# Patient Record
Sex: Female | Born: 1970 | Race: Black or African American | Hispanic: No | Marital: Single | Smoking: Former smoker
Health system: Southern US, Community
[De-identification: ages and names within clinical notes are randomized; demographics above are authoritative.]

## PROBLEM LIST (undated history)

## (undated) DIAGNOSIS — M5430 Sciatica, unspecified side: Secondary | ICD-10-CM

## (undated) DIAGNOSIS — I10 Essential (primary) hypertension: Secondary | ICD-10-CM

## (undated) HISTORY — PX: CARPAL TUNNEL RELEASE: SHX101

## (undated) HISTORY — DX: Sciatica, unspecified side: M54.30

## (undated) HISTORY — DX: Essential (primary) hypertension: I10

---

## 2019-11-07 ENCOUNTER — Emergency Department (HOSPITAL_COMMUNITY): Payer: Medicaid - Out of State

## 2019-11-07 ENCOUNTER — Other Ambulatory Visit: Payer: Self-pay

## 2019-11-07 ENCOUNTER — Emergency Department (HOSPITAL_COMMUNITY)
Admission: EM | Admit: 2019-11-07 | Discharge: 2019-11-07 | Disposition: A | Discharge status: Home / Self Care | Payer: Medicaid - Out of State | Attending: Emergency Medicine | Admitting: Emergency Medicine

## 2019-11-07 DIAGNOSIS — R222 Localized swelling, mass and lump, trunk: Secondary | ICD-10-CM | POA: Insufficient documentation

## 2019-11-07 DIAGNOSIS — Z202 Contact with and (suspected) exposure to infections with a predominantly sexual mode of transmission: Secondary | ICD-10-CM | POA: Insufficient documentation

## 2019-11-07 DIAGNOSIS — R109 Unspecified abdominal pain: Secondary | ICD-10-CM | POA: Insufficient documentation

## 2019-11-07 DIAGNOSIS — R112 Nausea with vomiting, unspecified: Secondary | ICD-10-CM | POA: Insufficient documentation

## 2019-11-07 DIAGNOSIS — R300 Dysuria: Secondary | ICD-10-CM | POA: Insufficient documentation

## 2019-11-07 DIAGNOSIS — N939 Abnormal uterine and vaginal bleeding, unspecified: Secondary | ICD-10-CM | POA: Diagnosis not present

## 2019-11-07 DIAGNOSIS — R509 Fever, unspecified: Secondary | ICD-10-CM | POA: Insufficient documentation

## 2019-11-07 DIAGNOSIS — Z711 Person with feared health complaint in whom no diagnosis is made: Secondary | ICD-10-CM

## 2019-11-07 DIAGNOSIS — N898 Other specified noninflammatory disorders of vagina: Secondary | ICD-10-CM | POA: Diagnosis present

## 2019-11-07 LAB — CBC WITH DIFFERENTIAL/PLATELET
Abs Immature Granulocytes: 0 10*3/uL (ref 0.00–0.07)
Basophils Absolute: 0 10*3/uL (ref 0.0–0.1)
Basophils Relative: 0 %
Eosinophils Absolute: 0.3 10*3/uL (ref 0.0–0.5)
Eosinophils Relative: 4 %
HCT: 37.5 % (ref 36.0–46.0)
Hemoglobin: 11.7 g/dL — ABNORMAL LOW (ref 12.0–15.0)
Lymphocytes Relative: 25 %
Lymphs Abs: 2.1 10*3/uL (ref 0.7–4.0)
MCH: 28 pg (ref 26.0–34.0)
MCHC: 31.2 g/dL (ref 30.0–36.0)
MCV: 89.7 fL (ref 80.0–100.0)
Monocytes Absolute: 1 10*3/uL (ref 0.1–1.0)
Monocytes Relative: 12 %
Neutro Abs: 4.9 10*3/uL (ref 1.7–7.7)
Neutrophils Relative %: 59 %
Platelets: 267 10*3/uL (ref 150–400)
RBC: 4.18 MIL/uL (ref 3.87–5.11)
RDW: 13.5 % (ref 11.5–15.5)
WBC: 8.3 10*3/uL (ref 4.0–10.5)
nRBC: 0 % (ref 0.0–0.2)
nRBC: 0 /100 WBC

## 2019-11-07 LAB — COMPREHENSIVE METABOLIC PANEL
ALT: 16 U/L (ref 0–44)
AST: 12 U/L — ABNORMAL LOW (ref 15–41)
Albumin: 3.1 g/dL — ABNORMAL LOW (ref 3.5–5.0)
Alkaline Phosphatase: 67 U/L (ref 38–126)
Anion gap: 11 (ref 5–15)
BUN: 8 mg/dL (ref 6–20)
CO2: 24 mmol/L (ref 22–32)
Calcium: 8.6 mg/dL — ABNORMAL LOW (ref 8.9–10.3)
Chloride: 101 mmol/L (ref 98–111)
Creatinine, Ser: 0.67 mg/dL (ref 0.44–1.00)
GFR, Estimated: >60 mL/min (ref >60)
Glucose, Bld: 115 mg/dL — ABNORMAL HIGH (ref 70–99)
Potassium: 3.2 mmol/L — ABNORMAL LOW (ref 3.5–5.1)
Sodium: 136 mmol/L (ref 135–145)
Total Bilirubin: 0.5 mg/dL (ref 0.3–1.2)
Total Protein: 7.1 g/dL (ref 6.5–8.1)

## 2019-11-07 LAB — I-STAT BETA HCG BLOOD, ED (MC, WL, AP ONLY): I-stat hCG, quantitative: <5 m[IU]/mL (ref <5)

## 2019-11-07 LAB — URINALYSIS, ROUTINE W REFLEX MICROSCOPIC
Bacteria, UA: NONE SEEN
Bilirubin Urine: NEGATIVE
Glucose, UA: NEGATIVE mg/dL
Ketones, ur: NEGATIVE mg/dL
Nitrite: NEGATIVE
Protein, ur: NEGATIVE mg/dL
Specific Gravity, Urine: 1.02 (ref 1.005–1.030)
pH: 5 (ref 5.0–8.0)

## 2019-11-07 LAB — WET PREP, GENITAL
Sperm: NONE SEEN
Trich, Wet Prep: NONE SEEN
Yeast Wet Prep HPF POC: NONE SEEN

## 2019-11-07 LAB — LACTIC ACID, PLASMA: Lactic Acid, Venous: 1.2 mmol/L (ref 0.5–1.9)

## 2019-11-07 LAB — HIV ANTIBODY (ROUTINE TESTING W REFLEX): HIV Screen 4th Generation wRfx: NONREACTIVE

## 2019-11-07 LAB — LIPASE, BLOOD: Lipase: 23 U/L (ref 11–51)

## 2019-11-07 MED ORDER — CEFTRIAXONE SODIUM 500 MG IJ SOLR
500.0000 mg | Freq: Once | INTRAMUSCULAR | Status: AC
  Administered 2019-11-07: 500 mg via INTRAMUSCULAR
  Filled 2019-11-07: qty 500

## 2019-11-07 MED ORDER — IOHEXOL 300 MG/ML  SOLN
100.0000 mL | Freq: Once | INTRAMUSCULAR | Status: AC | PRN
  Administered 2019-11-07: 100 mL via INTRAVENOUS

## 2019-11-07 MED ORDER — DEXTROSE 5 % IV SOLN: 500.0000 mg | Freq: Once | INTRAVENOUS | Status: DC

## 2019-11-07 MED ORDER — DOXYCYCLINE HYCLATE 100 MG PO CAPS: 100.0000 mg | ORAL_CAPSULE | Freq: Two times a day (BID) | ORAL | 0 refills | Status: DC

## 2019-11-07 MED ORDER — METRONIDAZOLE 500 MG PO TABS: 500.0000 mg | ORAL_TABLET | Freq: Two times a day (BID) | ORAL | 0 refills | Status: DC

## 2019-11-07 MED ORDER — LIDOCAINE HCL (PF) 1 % IJ SOLN
INTRAMUSCULAR | Status: AC
  Filled 2019-11-07: qty 5

## 2019-11-07 NOTE — Discharge Instructions (Addendum)
Begin taking doxycycline and Flagyl as prescribed.  Follow-up in the women's clinic in the next week.  Their contact information has been provided in this discharge summary for you to call make these arrangements.  We will call you if your cultures indicate you require further treatment or need to take additional action.  Return to the ER if symptoms significantly worsen or change.

## 2019-11-07 NOTE — ED Triage Notes (Signed)
Pt here with vaginal bleeding x2 weeks with bad cramps and odor. Pt also states she has a new lump on her left chest that is painless just concerning.

## 2019-11-07 NOTE — ED Provider Notes (Signed)
[facility] EMERGENCY DEPARTMENT Provider Note   CSN: [id number] Arrival date & time: 11/07/19  1032     History No chief complaint on file.   Veronica Gonzales is a 49 y.o. female.  The history is provided by the patient and medical records. No language interpreter was used.  Vaginal Discharge Quality:  Malodorous and thick Severity:  Severe Onset quality:  Gradual Duration:  2 weeks Timing:  Constant Progression:  Unchanged Chronicity:  New Relieved by:  Nothing Worsened by:  Nothing Ineffective treatments:  None tried Associated symptoms: abdominal pain, dysuria, fever, nausea and vomiting   Associated symptoms: no genital lesions, no urinary frequency, no urinary hesitancy and no urinary incontinence   Risk factors: new sexual partner   Risk factors: no PID and no STI        No past medical history on file.  There are no problems to display for this patient.     OB History   No obstetric history on file.     No family history on file.  Social History   Tobacco Use  . Smoking status: Not on file  Substance Use Topics  . Alcohol use: Not on file  . Drug use: Not on file    Home Medications Prior to Admission medications   Not on File    Allergies    Patient has no allergy information on record.  Review of Systems   Review of Systems  Constitutional: Positive for fatigue and fever. Negative for chills and diaphoresis.  HENT: Negative for congestion.   Eyes: Negative for visual disturbance.  Respiratory: Negative for cough, chest tightness, shortness of breath and wheezing.   Cardiovascular: Negative for chest pain and palpitations.  Gastrointestinal: Positive for abdominal pain, nausea and vomiting. Negative for constipation and diarrhea.  Genitourinary: Positive for dysuria, vaginal bleeding and vaginal discharge. Negative for bladder incontinence, decreased urine volume, flank pain, frequency and hesitancy.    Musculoskeletal: Negative for back pain, neck pain and neck stiffness.  Neurological: Negative for weakness, light-headedness, numbness and headaches.  Psychiatric/Behavioral: Negative for agitation.  All other systems reviewed and are negative.   Physical Exam Updated Vital Signs BP 134/87 (BP Location: Right Arm)   Pulse 71   Temp 98.6 F (37 C) (Oral)   Resp 17   Ht  (1.499 m)   Wt 98.9 kg   SpO2 98%   BMI 44.03 kg/m   Physical Exam Vitals and nursing note reviewed.  Constitutional:      General: She is not in acute distress.    Appearance: She is well-developed. She is not ill-appearing, toxic-appearing or diaphoretic.  HENT:     Head: Normocephalic and atraumatic.     Right Ear: External ear normal.     Left Ear: External ear normal.     Nose: Nose normal.     Mouth/Throat:     Mouth: Mucous membranes are moist.     Pharynx: No oropharyngeal exudate or posterior oropharyngeal erythema.  Eyes:     Conjunctiva/sclera: Conjunctivae normal.     Pupils: Pupils are equal, round, and reactive to light.  Cardiovascular:     Rate and Rhythm: Normal rate.     Pulses: Normal pulses.     Heart sounds: No murmur heard.   Pulmonary:     Effort: No respiratory distress.     Breath sounds: No stridor. No wheezing, rhonchi or rales.  Chest:     Chest wall: Swelling present. No tenderness.  Abdominal:     General: Abdomen is flat. There is no distension.     Tenderness: There is abdominal tenderness (mild). There is no right CVA tenderness, left CVA tenderness or rebound.  Musculoskeletal:        General: No tenderness.     Cervical back: Normal range of motion and neck supple. No tenderness.     Right lower leg: No edema.     Left lower leg: No edema.  Skin:    General: Skin is warm.     Capillary Refill: Capillary refill takes less than 2 seconds.     Findings: No erythema or rash.  Neurological:     General: No focal deficit present.     Mental Status: She  is alert and oriented to person, place, and time.     Motor: No abnormal muscle tone.     Coordination: Coordination normal.     Deep Tendon Reflexes: Reflexes are normal and symmetric.  Psychiatric:        Mood and Affect: Mood normal.     ED Results / Procedures / Treatments   Labs (all labs ordered are listed, but only abnormal results are displayed) Labs Reviewed  WET PREP, GENITAL - Abnormal; Notable for the following components:      Result Value   Clue Cells Wet Prep HPF POC PRESENT (*)    WBC, Wet Prep HPF POC MANY (*)    All other components within normal limits  CBC WITH DIFFERENTIAL/PLATELET - Abnormal; Notable for the following components:   Hemoglobin 11.7 (*)    All other components within normal limits  COMPREHENSIVE METABOLIC PANEL - Abnormal; Notable for the following components:   Potassium 3.2 (*)    Glucose, Bld 115 (*)    Calcium 8.6 (*)    Albumin 3.1 (*)    AST 12 (*)    All other components within normal limits  URINALYSIS, ROUTINE W REFLEX MICROSCOPIC - Abnormal; Notable for the following components:   APPearance HAZY (*)    Hgb urine dipstick SMALL (*)    Leukocytes,Ua LARGE (*)    All other components within normal limits  URINE CULTURE  CULTURE, BLOOD (ROUTINE X 2)  CULTURE, BLOOD (ROUTINE X 2)  LACTIC ACID, PLASMA  LIPASE, BLOOD  HIV ANTIBODY (ROUTINE TESTING W REFLEX)  LACTIC ACID, PLASMA  RPR  I-STAT BETA HCG BLOOD, ED ([facility], [facility], [facility] ONLY)  WET PREP  (BD AFFIRM) ([place])  GC/CHLAMYDIA PROBE AMP ([place]) NOT AT [facility]    EKG None  Radiology DG Chest 2 View  Result Date: 11/07/2019 CLINICAL DATA:  Left chest wall swelling. EXAM: CHEST - 2 VIEW COMPARISON:  None. FINDINGS: The heart size and mediastinal contours are within normal limits. Both lungs are clear. No visible pleural effusions or pneumothorax. The visualized skeletal structures are unremarkable. IMPRESSION: No active cardiopulmonary disease. Electronically Signed   By:  Feliberto Harts MD   On: 11/07/2019 12:34   US Transvaginal Non-OB  Result Date: 11/07/2019 CLINICAL DATA:  Vaginal discharge and bleeding, foul smelling discharge, lower abdominal pain; negative pregnancy test; LMP 10/26/2019 EXAM: TRANSABDOMINAL AND TRANSVAGINAL ULTRASOUND OF PELVIS DOPPLER ULTRASOUND OF OVARIES TECHNIQUE: Both transabdominal and transvaginal ultrasound examinations of the pelvis were performed. Transabdominal technique was performed for global imaging of the pelvis including uterus, ovaries, adnexal regions, and pelvic cul-de-sac. It was necessary to proceed with endovaginal exam following the transabdominal exam to visualize the endometrium, cervix, and LEFT ovary. Color and duplex Doppler  ultrasound was utilized to evaluate blood flow to the ovaries. COMPARISON:  None FINDINGS: Uterus Measurements: 12.9 x 4.4 x 5.7 cm = volume: 169 mL. Anteverted. No focal myometrial mass. Endometrium Thickness: 10 mm.  No endometrial fluid or focal abnormality. Right ovary Measurements: 3.7 x 2.9 x 3.8 cm = volume: 21.8 mL. Dominant physiologic follicle 2.9 cm diameter; no follow-up imaging recommended. Left ovary Not visualized, likely obscured by bowel Pulsed Doppler evaluation of RIGHT ovary demonstrates normal low-resistance arterial and venous waveforms. LEFT ovary not visualized for assessment Other findings Abnormal appearance of the cervix, distended by a combination of heterogeneous isoechoic to hypoechoic material measuring up to 5.1 x 3.2 x 3.5 cm. A portion of this appears to represent complex fluid question blood. An additional more echogenic focus 3.7 x 2.3 x 2.3 cm is seen which could potentially represent clot or an endocervical polyp. IMPRESSION: Nonvisualization of LEFT ovary. Normal appearing uterus, endometrial complex and RIGHT ovary. Abnormal appearance of the cervix, distended by a combination of heterogeneous isoechoic to hypoechoic material measuring up to 5.1 x 3.2 x 3.5 cm,  question blood and a 3.7 x 2.3 x 2.3 cm endocervical polyp versus clot; if sonohysterogram or hysteroscopy is not performed, recommend short-term follow-up ultrasound in 6 weeks to reassess. Electronically Signed   By: Ulyses Southward M.D.   On: 11/07/2019 15:17   US Pelvis Complete  Result Date: 11/07/2019 CLINICAL DATA:  Vaginal discharge and bleeding, foul smelling discharge, lower abdominal pain; negative pregnancy test; LMP 10/26/2019 EXAM: TRANSABDOMINAL AND TRANSVAGINAL ULTRASOUND OF PELVIS DOPPLER ULTRASOUND OF OVARIES TECHNIQUE: Both transabdominal and transvaginal ultrasound examinations of the pelvis were performed. Transabdominal technique was performed for global imaging of the pelvis including uterus, ovaries, adnexal regions, and pelvic cul-de-sac. It was necessary to proceed with endovaginal exam following the transabdominal exam to visualize the endometrium, cervix, and LEFT ovary. Color and duplex Doppler ultrasound was utilized to evaluate blood flow to the ovaries. COMPARISON:  None FINDINGS: Uterus Measurements: 12.9 x 4.4 x 5.7 cm = volume: 169 mL. Anteverted. No focal myometrial mass. Endometrium Thickness: 10 mm.  No endometrial fluid or focal abnormality. Right ovary Measurements: 3.7 x 2.9 x 3.8 cm = volume: 21.8 mL. Dominant physiologic follicle 2.9 cm diameter; no follow-up imaging recommended. Left ovary Not visualized, likely obscured by bowel Pulsed Doppler evaluation of RIGHT ovary demonstrates normal low-resistance arterial and venous waveforms. LEFT ovary not visualized for assessment Other findings Abnormal appearance of the cervix, distended by a combination of heterogeneous isoechoic to hypoechoic material measuring up to 5.1 x 3.2 x 3.5 cm. A portion of this appears to represent complex fluid question blood. An additional more echogenic focus 3.7 x 2.3 x 2.3 cm is seen which could potentially represent clot or an endocervical polyp. IMPRESSION: Nonvisualization of LEFT ovary.  Normal appearing uterus, endometrial complex and RIGHT ovary. Abnormal appearance of the cervix, distended by a combination of heterogeneous isoechoic to hypoechoic material measuring up to 5.1 x 3.2 x 3.5 cm, question blood and a 3.7 x 2.3 x 2.3 cm endocervical polyp versus clot; if sonohysterogram or hysteroscopy is not performed, recommend short-term follow-up ultrasound in 6 weeks to reassess. Electronically Signed   By: Ulyses Southward M.D.   On: 11/07/2019 15:17   [place] Art/Ven Flow Abd Pelv Doppler  Result Date: 11/07/2019 CLINICAL DATA:  Vaginal discharge and bleeding, foul smelling discharge, lower abdominal pain; negative pregnancy test; LMP 10/26/2019 EXAM: TRANSABDOMINAL AND TRANSVAGINAL ULTRASOUND OF PELVIS DOPPLER ULTRASOUND OF OVARIES TECHNIQUE:  Both transabdominal and transvaginal ultrasound examinations of the pelvis were performed. Transabdominal technique was performed for global imaging of the pelvis including uterus, ovaries, adnexal regions, and pelvic cul-de-sac. It was necessary to proceed with endovaginal exam following the transabdominal exam to visualize the endometrium, cervix, and LEFT ovary. Color and duplex Doppler ultrasound was utilized to evaluate blood flow to the ovaries. COMPARISON:  None FINDINGS: Uterus Measurements: 12.9 x 4.4 x 5.7 cm = volume: 169 mL. Anteverted. No focal myometrial mass. Endometrium Thickness: 10 mm.  No endometrial fluid or focal abnormality. Right ovary Measurements: 3.7 x 2.9 x 3.8 cm = volume: 21.8 mL. Dominant physiologic follicle 2.9 cm diameter; no follow-up imaging recommended. Left ovary Not visualized, likely obscured by bowel Pulsed Doppler evaluation of RIGHT ovary demonstrates normal low-resistance arterial and venous waveforms. LEFT ovary not visualized for assessment Other findings Abnormal appearance of the cervix, distended by a combination of heterogeneous isoechoic to hypoechoic material measuring up to 5.1 x 3.2 x 3.5 cm. A portion of this  appears to represent complex fluid question blood. An additional more echogenic focus 3.7 x 2.3 x 2.3 cm is seen which could potentially represent clot or an endocervical polyp. IMPRESSION: Nonvisualization of LEFT ovary. Normal appearing uterus, endometrial complex and RIGHT ovary. Abnormal appearance of the cervix, distended by a combination of heterogeneous isoechoic to hypoechoic material measuring up to 5.1 x 3.2 x 3.5 cm, question blood and a 3.7 x 2.3 x 2.3 cm endocervical polyp versus clot; if sonohysterogram or hysteroscopy is not performed, recommend short-term follow-up ultrasound in 6 weeks to reassess. Electronically Signed   By: Ulyses Southward M.D.   On: 11/07/2019 15:17    Procedures Procedures (including critical care time)  Medications Ordered in ED Medications - No data to display  ED Course  I have reviewed the triage vital signs and the nursing notes.  Pertinent labs & imaging results that were available during my care of the patient were reviewed by me and considered in my medical decision making (see chart for details).    MDM Rules/Calculators/A&P                          Allona Gondek is a 49 y.o. female with no significant past medical history who presents with several complaints including 2 weeks of foul-smelling vaginal discharge, pelvic pain, mid and upper abdominal pain, intermittent fevers and chills, nausea/vomiting, and a left chest wall lump.  Patient reports that she has never had these symptoms in the past.  She denies history of STI, PID, BV, or recurrent urinary tract infections.  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  She reports that she has developed a very foul-smelling significant vaginal discharge and has had vaginal bleeding as well for the last 2 weeks.  She reports that she is still getting her menstrual cycle.  She says the abdominal pain she is experiencing  seen worse than her previous menstrual pain and was including her upper abdomen.  She says that this has been ongoing and waxing waning and is currently moderate in severity.  She reports it has been extremely severe.  She reports that several days ago she had fevers and chills.  She says that over the last week and a half she developed a large lump on her left upper chest which she does not  know was a lymph node or some other cause.  She denies any tenderness or pain at the site.  She denies any upper extremity symptoms.  She reports that last month she had her Covid vaccine in her left upper arm but reports the lump did not show up shortly thereafter.  She denies any headache, vision changes, or other neurologic symptoms.  She denies any new rashes aside from a year-long rash on her right leg which is unchanged.  She presents for evaluation.  On exam with a chaperone, patient's lungs were clear.  Chest was nontender.  There is a large mobile bump in her left upper chest wall near the clavicle.  It is not pulsatile and did not have a bruit.  She had normal strength and sensation in upper extremities with no tenderness or bumps in the axilla.  Good pulses in upper extremities.  Abdomen was minimally tender on my exam inferiorly but she reports her previous pain was in the upper abdomen.  She denies any tenderness in her back and no rashes otherwise seen on her torso.  She did have a small rash on her right calf which she reports is chronic and does not appear acute or concerning at this time.  A pelvic exam was performed with a chaperone and she did have a vaginal discharge and had some mild cervical motion tenderness.  She did not have adnexal tenderness on my exam.  There was no vaginal laceration, abrasion, or injury seen.  No bleeding seen.  Clinically I am somewhat concerned about several etiologies of her symptoms.  With her new sexual partner, concerned about possible STI, PID, or even TOA given the  amount of pain with the intermittent fevers and chills with nausea and vomiting.  We discussed the possibility of infection being more disseminated including causing a lymph node in her chest wall.  With the upper abdominal pain with nausea and vomiting, consider Lynnae January or other more widespread infections.  We will start with a ultrasound of her pelvis to look for TOA or other abnormality causing the vaginal bleeding.  We will get swabs.  Anticipate empiric antibiotics for STI coverage.  She also get a chest x-ray given the fevers, chills, and this lump on her chest.  We will also test for HIV given consideration of a [place] lymph node.  Anticipate reassessment after work-up to determine disposition.  Patient's work-up began to return.  Her ultrasound does not show evidence of TOA or significant abnormalities.  There was abnormal appearance of the cervix with some distention which may have another due to discharge or clot.  Patient will have follow-up and we discussed this.  Clinically I do think she may have STI with PID given the discharge.  Her wet prep did show BV so this may attribute some of the discharge however due to the discomfort, she will need to be treated with antibiotics for possible STI or PID as well.  Other work-up began to return reassuring thus far.  Her chest x-ray did not show pneumonia or large shadow from the lymph node appearing mass.  I had a long shared decision made conversation with patient and there is still concern for possible lymphoma or other cause of this large nontender mass.  Patient is amenable to getting CT chest/abdomen/pelvis to look for concerning underlying findings.  If this is reassuring, anticipate discharge with antibiotics for STI/PID as well as instructions to follow-up with PCP and we will likely attribute the large  swelling/lymph node on her left chest due to recent Covid shot in her left arm which led to this lymphadenopathy.  If  abnormalities are discovered on imaging, will dispo accordingly.  Care transferred to Dr. Judd Lienelo while waiting for CT results.   Final Clinical Impression(s) / ED Diagnoses Final diagnoses:  Vaginal discharge  Vaginal bleeding  Concern about sexually transmitted disease in female without diagnosis  Mass of left chest wall   Clinical Impression: 1. Vaginal discharge   2. Vaginal bleeding   3. Concern about sexually transmitted disease in female without diagnosis   4. Mass of left chest wall     Disposition: Care transferred to Dr. Judd Lienelo while waiting for CT results.  This note was prepared with assistance of Conservation officer, historic buildingsDragon voice recognition software. Occasional wrong-word or sound-a-like substitutions may have occurred due to the inherent limitations of voice recognition software.      Welton Bord, Canary Brimhristopher J, MD 11/07/19 [id number]

## 2019-11-08 LAB — RPR: RPR Ser Ql: NONREACTIVE

## 2019-11-09 LAB — URINE CULTURE

## 2019-11-10 LAB — GC/CHLAMYDIA PROBE AMP (~~LOC~~) NOT AT ARMC
Chlamydia: NEGATIVE
Comment: NEGATIVE
Comment: NORMAL
Neisseria Gonorrhea: NEGATIVE

## 2019-11-12 LAB — CULTURE, BLOOD (ROUTINE X 2)
Culture: NO GROWTH
Culture: NO GROWTH
Special Requests: ADEQUATE
Special Requests: ADEQUATE

## 2021-04-13 DIAGNOSIS — M5416 Radiculopathy, lumbar region: Secondary | ICD-10-CM | POA: Insufficient documentation

## 2021-06-27 ENCOUNTER — Other Ambulatory Visit: Payer: Self-pay | Admitting: *Deleted

## 2021-06-27 DIAGNOSIS — K8020 Calculus of gallbladder without cholecystitis without obstruction: Secondary | ICD-10-CM

## 2021-07-11 ENCOUNTER — Ambulatory Visit
Admission: RE | Admit: 2021-07-11 | Discharge: 2021-07-11 | Disposition: A | Discharge status: Home / Self Care | Payer: 59 | Source: Ambulatory Visit | Attending: *Deleted | Admitting: *Deleted

## 2021-07-11 DIAGNOSIS — K8020 Calculus of gallbladder without cholecystitis without obstruction: Secondary | ICD-10-CM

## 2021-08-16 ENCOUNTER — Encounter (HOSPITAL_BASED_OUTPATIENT_CLINIC_OR_DEPARTMENT_OTHER): Payer: Self-pay | Admitting: Physical Therapy

## 2021-08-16 ENCOUNTER — Ambulatory Visit (HOSPITAL_BASED_OUTPATIENT_CLINIC_OR_DEPARTMENT_OTHER): Payer: Commercial Managed Care - HMO | Attending: Orthopedic Surgery | Admitting: Physical Therapy

## 2021-08-16 DIAGNOSIS — R29898 Other symptoms and signs involving the musculoskeletal system: Secondary | ICD-10-CM

## 2021-08-16 DIAGNOSIS — M5451 Vertebrogenic low back pain: Secondary | ICD-10-CM | POA: Diagnosis present

## 2021-08-16 DIAGNOSIS — Z5189 Encounter for other specified aftercare: Secondary | ICD-10-CM | POA: Diagnosis not present

## 2021-08-16 DIAGNOSIS — M6281 Muscle weakness (generalized): Secondary | ICD-10-CM

## 2021-08-16 DIAGNOSIS — M5459 Other low back pain: Secondary | ICD-10-CM | POA: Diagnosis not present

## 2021-08-16 DIAGNOSIS — R208 Other disturbances of skin sensation: Secondary | ICD-10-CM | POA: Diagnosis not present

## 2021-08-16 NOTE — Therapy (Signed)
OUTPATIENT PHYSICAL THERAPY THORACOLUMBAR EVALUATION   Patient Name: Veronica Gonzales MRN: [id number] DOB:[birth year], 51 y.o., female Today's Date: 08/16/2021   PT End of Session - 08/16/21 1548     Visit Number 1    Number of Visits 17    Date for PT Re-Evaluation 10/11/21    Authorization Time Period 08/16/21 to 10/11/21    PT Start Time 1443   arrived late   PT Stop Time 1514    PT Time Calculation (min) 31 min    Activity Tolerance Patient tolerated treatment well    Behavior During Therapy [facility] for tasks assessed/performed             History reviewed. No pertinent past medical history. History reviewed. No pertinent surgical history. There are no problems to display for this patient.   PCP: does not currently have   REFERRING PROVIDER: Venita Lick, MD   REFERRING DIAG: M54.51 (ICD-10-CM) - Vertebrogenic low back pain   Rationale for Evaluation and Treatment Rehabilitation  THERAPY DIAG:  Other low back pain - Plan: PT plan of care cert/re-cert  Muscle weakness (generalized) - Plan: PT plan of care cert/re-cert  Other symptoms and signs involving the musculoskeletal system - Plan: PT plan of care cert/re-cert  Other disturbances of skin sensation - Plan: PT plan of care cert/re-cert  ONSET DATE: 07/28/2021   SUBJECTIVE:                                                                                                                                                                                           SUBJECTIVE STATEMENT:  This started in March, I don't recall any specific injury that started it; I do work a lot, they did see a cyst on top of the nerve when they did the MRI I don't know what really happened if it deflated itself or what happened. When I first saw Dr. Shon Baton I was having to crawl in the house due to pain, he gave me some medicines and let me rest. 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 Stairs are hard for me now. My job varies  sometimes I'm just doing ADLs, sometimes I do have to do transfers with my patients. I have numbness in my left toes, sciatica is very painful on that side, I feel more hypersensitive to cold and itching since this started too. Had covid for the first time in June, my endurance is still not good.   PERTINENT HISTORY:  Summary: Veronica Gonzales returns today for follow-up. Unfortunately 3 to 5 days after she left my office she contracted COVID and was unable to attend  physical therapy. She states that she has done some self-directed exercises and is noted significant improvement.   At this point I have refilled her gabapentin as this is helping to relieve her pain and we will start formalized aquatic physical therapy. If she fails to improve or there is worsening of her symptoms she knows to contact me I will be happy to see her back. Otherwise, she can follow-up with me on an as-needed basis.     PAIN:  Are you having pain? 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 No  LIVING ENVIRONMENT: Lives with: lives alone Lives in: House/apartment Stairs: Yes: Internal: 16 steps; on right going up Has following equipment at home: None  OCCUPATION: nurse aide   PLOF: Independent, Independent with basic ADLs, Independent with gait, and Independent with transfers  PATIENT GOALS get pain down, be able to get upstairs easier, build endurance back up    OBJECTIVE:   DIAGNOSTIC FINDINGS:    PATIENT SURVEYS:  FOTO will do next session  SCREENING FOR RED FLAGS: Bowel or bladder incontinence: No Spinal tumors: No Cauda equina syndrome: No Compression fracture: No Abdominal aneurysm:  No  COGNITION:  Overall cognitive status: Within functional limits for tasks assessed     SENSATION: Not tested  MUSCLE LENGTH: Hamstrings WNL R, moderate limitation L  Piriformis WNL R, moderate limitation L  POSTURE: rounded shoulders, forward head, increased thoracic kyphosis, and flexed trunk   PALPATION: Lumbar and thoracic paraspinals tight but not TTP, no areas excessively tight or sore in B glutes or piriformis groups   LUMBAR ROM:   Active  A/PROM  eval  Flexion Mild limitation, RFIS no change in pain  Extension WNL, REIS improvement in pain   Right lateral flexion Moderate limitation   Left lateral flexion Moderate limitation   Right rotation   Left rotation    (Blank rows = not tested)  LOWER EXTREMITY ROM:     Active  Right eval Left eval  Hip flexion    Hip extension    Hip abduction    Hip adduction    Hip internal rotation    Hip external rotation    Knee flexion    Knee extension    Ankle dorsiflexion    Ankle plantarflexion    Ankle inversion    Ankle eversion     (Blank rows = not tested)  LOWER EXTREMITY MMT:    MMT Right eval Left eval  Hip flexion 4 4  Hip extension 3+ 3+  Hip abduction 4+ 4+  Hip adduction    Hip internal rotation    Hip external rotation    Knee flexion 4+ 4  Knee extension 4+ 4+  Ankle dorsiflexion 5 5  Ankle plantarflexion    Ankle inversion    Ankle eversion     (Blank rows = not tested)  LUMBAR SPECIAL TESTS:    FUNCTIONAL TESTS:    GAIT: Distance walked: in clinic distances  Assistive device utilized: None Level of assistance: Complete Independence Comments: antalgic, limited hip rotation, flexed at hips, holds trunk very rigid     TODAY'S TREATMENT  Prone press ups 1x10 TrA sets  1x5 3 second holds Hamstring stretch x30 seconds L LE Piriformis stretch x30 seconds  L LE    PATIENT EDUCATION:  Education details: POC,HEP, exam findings, biomechanics  Person educated: Patient Education  method: Medical illustrator Education comprehension: verbalized understanding and returned demonstration   HOME EXERCISE PROGRAM: [id number]   ASSESSMENT:  CLINICAL IMPRESSION: Patient is a 51 y.o. female who was seen today for physical therapy evaluation and treatment for back pain. Exam reveals significant postural limitations, mm weakness, impaired sensation, impaired flexibility, impaired biomechanics, and definite extension preference. Able to reduce pain to 0/10 with prone press-ups this afternoon. Will benefit from skilled PT services in order to reduce pain and assist in return to optimal level of function.    OBJECTIVE IMPAIRMENTS Abnormal gait, difficulty walking, decreased ROM, decreased strength, hypomobility, increased fascial restrictions, increased muscle spasms, impaired flexibility, impaired sensation, improper body mechanics, postural dysfunction, obesity, and pain.   ACTIVITY LIMITATIONS carrying, lifting, bending, sitting, standing, squatting, stairs, transfers, locomotion level, and caring for others  PARTICIPATION LIMITATIONS: driving, shopping, community activity, occupation, and yard work  PERSONAL FACTORS Age, Fitness, Past/current experiences, Profession, and Time since onset of injury/illness/exacerbation are also affecting patient's functional outcome.   REHAB POTENTIAL: Good  CLINICAL DECISION MAKING: Stable/uncomplicated  EVALUATION COMPLEXITY: Low   GOALS: Goals reviewed with patient? Yes  SHORT TERM GOALS: Target date: 09/13/2021  Will be compliant with appropriate progressive HEP  Baseline: Goal status: INITIAL  2.  Pain to be no more than 4/10 at worst and sciatic pain with have improved by 50% in intensity  Baseline:  Goal status: INITIAL  3.  Will have better understanding of posture and biomechanics  Baseline:  Goal status: INITIAL  4.  Will be able to climb flight of step with U rail and step to pattern without increase in pain   Baseline:  Goal status: INITIAL    LONG TERM GOALS: Target date: 10/11/2021  MMT to be 5/5 globally  Baseline:  Goal status: INITIAL  2.  Pain to be no more than 2/10 at worst with functional task performance  Baseline:  Goal status: INITIAL  3.  Will be able to ascend/descent full flight of steps with no rails and reciprocal pattern, no increase in pain  Baseline:  Goal status: INITIAL  4.  Will demonstrate ability to perform mock/simulated patient transfers with good mechanics in order to assist in preventing injury at her job  Baseline:  Goal status: INITIAL     PLAN: PT FREQUENCY: 2x/week  PT DURATION: 8 weeks  PLANNED INTERVENTIONS: Therapeutic exercises, Therapeutic activity, Neuromuscular re-education, Balance training, Gait training, Patient/Family education, Self Care, Joint mobilization, Stair training, DME instructions, Aquatic Therapy, Dry Needling, Electrical stimulation, Spinal mobilization, Cryotherapy, Moist heat, Taping, Traction, Ultrasound, Ionotophoresis 4mg /ml Dexamethasone, Manual therapy, and Re-evaluation.  PLAN FOR NEXT SESSION: focus on extension based program, otherwise postural training and core strengthening, biomechanics training    Khris Jansson U PT DPT PN2  08/16/2021, 4:02 PM

## 2021-08-18 ENCOUNTER — Encounter (HOSPITAL_BASED_OUTPATIENT_CLINIC_OR_DEPARTMENT_OTHER): Payer: Self-pay | Admitting: Physical Therapy

## 2021-08-18 ENCOUNTER — Ambulatory Visit (HOSPITAL_BASED_OUTPATIENT_CLINIC_OR_DEPARTMENT_OTHER): Payer: Commercial Managed Care - HMO | Admitting: Physical Therapy

## 2021-08-18 DIAGNOSIS — M5459 Other low back pain: Secondary | ICD-10-CM

## 2021-08-18 DIAGNOSIS — M5451 Vertebrogenic low back pain: Secondary | ICD-10-CM | POA: Diagnosis not present

## 2021-08-18 DIAGNOSIS — R29898 Other symptoms and signs involving the musculoskeletal system: Secondary | ICD-10-CM

## 2021-08-18 DIAGNOSIS — R208 Other disturbances of skin sensation: Secondary | ICD-10-CM

## 2021-08-18 DIAGNOSIS — M6281 Muscle weakness (generalized): Secondary | ICD-10-CM

## 2021-08-18 NOTE — Therapy (Signed)
OUTPATIENT PHYSICAL THERAPY THORACOLUMBAR EVALUATION   Patient Name: Audriella Blakeley MRN: [id number] DOB:1970-10-19, 51 y.o., female Today's Date: 08/18/2021   PT End of Session - 08/18/21 1459     Visit Number 2    Number of Visits 17    Date for PT Re-Evaluation 10/11/21    Authorization Time Period 08/16/21 to 10/11/21    PT Start Time 1450    PT Stop Time 1530    PT Time Calculation (min) 40 min    Activity Tolerance Patient tolerated treatment well    Behavior During Therapy [facility] for tasks assessed/performed              History reviewed. No pertinent past medical history. History reviewed. No pertinent surgical history. There are no problems to display for this patient.   PCP: does not currently have   REFERRING PROVIDER: Venita Lick, MD   REFERRING DIAG: M54.51 (ICD-10-CM) - Vertebrogenic low back pain   Rationale for Evaluation and Treatment Rehabilitation  THERAPY DIAG:  Other low back pain  Muscle weakness (generalized)  Other symptoms and signs involving the musculoskeletal system  Other disturbances of skin sensation  ONSET DATE: 07/28/2021   SUBJECTIVE:                                                                                                                                                                                          Current subjective: "I have a hard time differentiating between OA pain and sciatica" SUBJECTIVE STATEMENT:  This started in March, I don't recall any specific injury that started it; I do work a lot, they did see a cyst on top of the nerve when they did the MRI I don't know what really happened if it deflated itself or what happened. When I first saw Dr. Shon Baton I was having to crawl in the house due to pain, he gave me some medicines and let me rest. 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 Stairs are hard for me now. My job varies sometimes I'm just doing ADLs, sometimes I do have to do transfers with my  patients. I have numbness in my left toes, sciatica is very painful on that side, I feel more hypersensitive to cold and itching since this started too. Had covid for the first time in June, my endurance is still not good.   PERTINENT HISTORY:  Summary: Caniya returns today for follow-up. Unfortunately 3 to 5 days after she left my office she contracted COVID and was unable to attend physical therapy. She states that she has done some self-directed exercises and is noted significant improvement.  At this point I have refilled her gabapentin as this is helping to relieve her pain and we will start formalized aquatic physical therapy. If she fails to improve or there is worsening of her symptoms she knows to contact me I will be happy to see her back. Otherwise, she can follow-up with me on an as-needed basis.     PAIN:  Are you having pain? 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 No  LIVING ENVIRONMENT: Lives with: lives alone Lives in: House/apartment Stairs: Yes: Internal: 16 steps; on right going up Has following equipment at home: None  OCCUPATION: nurse aide   PLOF: Independent, Independent with basic ADLs, Independent with gait, and Independent with transfers  PATIENT GOALS get pain down, be able to get upstairs easier, build endurance back up    OBJECTIVE:   DIAGNOSTIC FINDINGS:    PATIENT SURVEYS:  FOTO will do next session  SCREENING FOR RED FLAGS: Bowel or bladder incontinence: No Spinal tumors: No Cauda equina syndrome: No Compression fracture: No Abdominal aneurysm: No  COGNITION:  Overall cognitive status: Within functional limits for tasks  assessed     SENSATION: Not tested  MUSCLE LENGTH: Hamstrings WNL R, moderate limitation L  Piriformis WNL R, moderate limitation L  POSTURE: rounded shoulders, forward head, increased thoracic kyphosis, and flexed trunk   PALPATION: Lumbar and thoracic paraspinals tight but not TTP, no areas excessively tight or sore in B glutes or piriformis groups   LUMBAR ROM:   Active  A/PROM  eval  Flexion Mild limitation, RFIS no change in pain  Extension WNL, REIS improvement in pain   Right lateral flexion Moderate limitation   Left lateral flexion Moderate limitation   Right rotation   Left rotation    (Blank rows = not tested)  LOWER EXTREMITY ROM:     Active  Right eval Left eval  Hip flexion    Hip extension    Hip abduction    Hip adduction    Hip internal rotation    Hip external rotation    Knee flexion    Knee extension    Ankle dorsiflexion    Ankle plantarflexion    Ankle inversion    Ankle eversion     (Blank rows = not tested)  LOWER EXTREMITY MMT:    MMT Right eval Left eval  Hip flexion 4 4  Hip extension 3+ 3+  Hip abduction 4+ 4+  Hip adduction    Hip internal rotation    Hip external rotation    Knee flexion 4+ 4  Knee extension 4+ 4+  Ankle dorsiflexion 5 5  Ankle plantarflexion    Ankle inversion    Ankle eversion     (Blank rows = not tested)  LUMBAR SPECIAL TESTS:    FUNCTIONAL TESTS:    GAIT: Distance walked: in clinic distances  Assistive device utilized: None Level of assistance: Complete Independence Comments: antalgic, limited hip rotation, flexed at hips, holds trunk very rigid     TODAY'S TREATMENT  Prone press ups 1x10 TrA sets 1x5 3 second holds Hamstring stretch x30 seconds L LE Piriformis stretch x30 seconds  L LE  PATIENT EDUCATION:  Education details: POC,HEP, exam findings, biomechanics  Person educated: Patient Education method: Customer service manager Education comprehension: verbalized  understanding and returned demonstration   HOME EXERCISE PROGRAM: [id number]   ASSESSMENT:  CLINICAL IMPRESSION: Pt safe and indep in setting.  EDU on properties of water and benefits of aquatic therapy. She is directed through stretching and gentle strengthening exercises to improve movement and decrease discomfort.  She reports no sciatica pain upon completion although continued with pain sx from OA. She gained good lumbar and rotational stretching.  Pt reports feeling tight throughout her posterior core.  Has been compliant with HEP. She is a good candidate for aquatic therapy using the properties of water to facilitate and hasten progression towards goals.  Patient is a 51 y.o. female who was seen today for physical therapy evaluation and treatment for back pain. Exam reveals significant postural limitations, mm weakness, impaired sensation, impaired flexibility, impaired biomechanics, and definite extension preference. Able to reduce pain to 0/10 with prone press-ups this afternoon. Will benefit from skilled PT services in order to reduce pain and assist in return to optimal level of function.    OBJECTIVE IMPAIRMENTS Abnormal gait, difficulty walking, decreased ROM, decreased strength, hypomobility, increased fascial restrictions, increased muscle spasms, impaired flexibility, impaired sensation, improper body mechanics, postural dysfunction, obesity, and pain.   ACTIVITY LIMITATIONS carrying, lifting, bending, sitting, standing, squatting, stairs, transfers, locomotion level, and caring for others  PARTICIPATION LIMITATIONS: driving, shopping, community activity, occupation, and yard work  PERSONAL FACTORS Age, Fitness, Past/current experiences, Profession, and Time since onset of injury/illness/exacerbation are also affecting patient's functional outcome.   REHAB POTENTIAL: Good  CLINICAL DECISION MAKING: Stable/uncomplicated  EVALUATION COMPLEXITY: Low   GOALS: Goals reviewed  with patient? Yes  SHORT TERM GOALS: Target date: 09/13/2021  Will be compliant with appropriate progressive HEP  Baseline: Goal status: INITIAL  2.  Pain to be no more than 4/10 at worst and sciatic pain with have improved by 50% in intensity  Baseline:  Goal status: INITIAL  3.  Will have better understanding of posture and biomechanics  Baseline:  Goal status: INITIAL  4.  Will be able to climb flight of step with U rail and step to pattern without increase in pain  Baseline:  Goal status: INITIAL    LONG TERM GOALS: Target date: 10/11/2021  MMT to be 5/5 globally  Baseline:  Goal status: INITIAL  2.  Pain to be no more than 2/10 at worst with functional task performance  Baseline:  Goal status: INITIAL  3.  Will be able to ascend/descent full flight of steps with no rails and reciprocal pattern, no increase in pain  Baseline:  Goal status: INITIAL  4.  Will demonstrate ability to perform mock/simulated patient transfers with good mechanics in order to assist in preventing injury at her job  Baseline:  Goal status: INITIAL     PLAN: PT FREQUENCY: 2x/week  PT DURATION: 8 weeks  PLANNED INTERVENTIONS: Therapeutic exercises, Therapeutic activity, Neuromuscular re-education, Balance training, Gait training, Patient/Family education, Self Care, Joint mobilization, Stair training, DME instructions, Aquatic Therapy, Dry Needling, Electrical stimulation, Spinal mobilization, Cryotherapy, Moist heat, Taping, Traction, Ultrasound, Ionotophoresis 4mg /ml Dexamethasone, Manual therapy, and Re-evaluation.  PLAN FOR NEXT SESSION: focus on extension based program, otherwise postural training and core strengthening, biomechanics training    Stanton Kidney Tharon Aquas) Quana Chamberlain MPT 08/18/2021, 5:54 PM

## 2021-08-23 ENCOUNTER — Ambulatory Visit (HOSPITAL_BASED_OUTPATIENT_CLINIC_OR_DEPARTMENT_OTHER): Payer: Commercial Managed Care - HMO | Admitting: Physical Therapy

## 2021-08-23 ENCOUNTER — Encounter (HOSPITAL_BASED_OUTPATIENT_CLINIC_OR_DEPARTMENT_OTHER): Payer: Self-pay | Admitting: Physical Therapy

## 2021-08-23 DIAGNOSIS — R29898 Other symptoms and signs involving the musculoskeletal system: Secondary | ICD-10-CM

## 2021-08-23 DIAGNOSIS — R208 Other disturbances of skin sensation: Secondary | ICD-10-CM

## 2021-08-23 DIAGNOSIS — M5451 Vertebrogenic low back pain: Secondary | ICD-10-CM | POA: Diagnosis not present

## 2021-08-23 DIAGNOSIS — M5459 Other low back pain: Secondary | ICD-10-CM

## 2021-08-23 DIAGNOSIS — M6281 Muscle weakness (generalized): Secondary | ICD-10-CM

## 2021-08-23 NOTE — Therapy (Signed)
OUTPATIENT PHYSICAL THERAPY THORACOLUMBAR TREATMENT NOTE   Patient Name: Veronica Gonzales MRN: [id number] DOB:[birth year], 51 y.o., female Today's Date: 08/23/2021   PT End of Session - 08/23/21 1204     Visit Number 3    Number of Visits 17    Date for PT Re-Evaluation 10/11/21    Authorization Time Period 08/16/21 to 10/11/21    PT Start Time 1202    PT Stop Time 1240    PT Time Calculation (min) 38 min    Activity Tolerance Patient tolerated treatment well    Behavior During Therapy [facility] for tasks assessed/performed              History reviewed. No pertinent past medical history. History reviewed. No pertinent surgical history. There are no problems to display for this patient.   PCP: does not currently have   REFERRING PROVIDER: Venita Lick, MD   REFERRING DIAG: M54.51 (ICD-10-CM) - Vertebrogenic low back pain   Rationale for Evaluation and Treatment Rehabilitation  THERAPY DIAG:  Other low back pain  Muscle weakness (generalized)  Other symptoms and signs involving the musculoskeletal system  Other disturbances of skin sensation  ONSET DATE: 07/28/2021   SUBJECTIVE:                                                                                                                                                                                          SUBJECTIVE STATEMENT:  Pt reports she was exhausted after last aquatic session.   She did started a beta blocker the same day, " That might have affected me too".  She reports that returning to work (12 hr shifts) has been challenging.   PERTINENT HISTORY:  Summary: [name] returns today for follow-up. Unfortunately 3 to 5 days after she left my office she contracted COVID and was unable to attend physical therapy. She states that she has done some self-directed exercises and is noted significant improvement.   At this point I have refilled her gabapentin as this is helping to relieve her pain and we will start  formalized aquatic physical therapy. If she fails to improve or there is worsening of her symptoms she knows to contact me I will be happy to see her back. Otherwise, she can follow-up with me on an as-needed basis.     PAIN:  Are you having pain? Yes: NPRS scale: 5/10, Pain location: Generalized  Pain description: combo of nerve pain, dull throbbing  Aggravating factors: sitting in recliner, transitions  Relieving factors: laying on the floor, rest    PRECAUTIONS: None  WEIGHT BEARING RESTRICTIONS No  FALLS:  Has patient fallen in last 6  months? No  LIVING ENVIRONMENT: Lives with: lives alone Lives in: House/apartment Stairs: Yes: Internal: 16 steps; on right going up Has following equipment at home: None  OCCUPATION: nurse aide   PLOF: Independent, Independent with basic ADLs, Independent with gait, and Independent with transfers  PATIENT GOALS get pain down, be able to get upstairs easier, build endurance back up    OBJECTIVE:   DIAGNOSTIC FINDINGS:    PATIENT SURVEYS:  FOTO will do next session  SCREENING FOR RED FLAGS: Bowel or bladder incontinence: No Spinal tumors: No Cauda equina syndrome: No Compression fracture: No Abdominal aneurysm: No  COGNITION:  Overall cognitive status: Within functional limits for tasks assessed     SENSATION: Not tested  MUSCLE LENGTH: Hamstrings WNL R, moderate limitation L  Piriformis WNL R, moderate limitation L  POSTURE: rounded shoulders, forward head, increased thoracic kyphosis, and flexed trunk   PALPATION: Lumbar and thoracic paraspinals tight but not TTP, no areas excessively tight or sore in B glutes or piriformis groups   LUMBAR ROM:   Active  A/PROM  eval  Flexion Mild limitation, RFIS no change in pain  Extension WNL, REIS improvement in pain   Right lateral flexion Moderate limitation   Left lateral flexion Moderate limitation   Right rotation   Left rotation    (Blank rows = not tested)  LOWER  EXTREMITY ROM:     Active  Right eval Left eval  Hip flexion    Hip extension    Hip abduction    Hip adduction    Hip internal rotation    Hip external rotation    Knee flexion    Knee extension    Ankle dorsiflexion    Ankle plantarflexion    Ankle inversion    Ankle eversion     (Blank rows = not tested)  LOWER EXTREMITY MMT:    MMT Right eval Left eval  Hip flexion 4 4  Hip extension 3+ 3+  Hip abduction 4+ 4+  Hip adduction    Hip internal rotation    Hip external rotation    Knee flexion 4+ 4  Knee extension 4+ 4+  Ankle dorsiflexion 5 5  Ankle plantarflexion    Ankle inversion    Ankle eversion     (Blank rows = not tested)  LUMBAR SPECIAL TESTS:    FUNCTIONAL TESTS:    GAIT: Distance walked: in clinic distances  Assistive device utilized: None Level of assistance: Complete Independence Comments: antalgic, limited hip rotation, flexed at hips, holds trunk very rigid     TODAY'S TREATMENT  Pt seen for aquatic therapy today.  Treatment took place in water 3.25-69ft 8" in depth at the [facility] pool. Temp of water was 91.  Pt entered/exited the pool via stairs independently with bilat rail.  * holding yellow noodle:  forward/ backward gait;  side stepping with cues for form; high knee marching; hip openers; hip crosses (hip/knee flexion that cross;  * holding wall:  squats * straddling yellow noodle and holding wall in corner, cycling and hip abdct/add * stretches at stairs:  quad stretch with foot on 2nd step, hamstring stretch with foot on 2nd step; fig 4 stretch holding rails; L stretch holding rails   Pt requires the buoyancy and hydrostatic pressure of water for support, and to offload joints by unweighting joint load by at least 50 % in navel deep water and by at least 75-80% in chest to neck deep water.  Viscosity of the water  is needed for resistance of strengthening. Water current perturbations provides challenge to standing balance  requiring increased core activation.     PATIENT EDUCATION:  Education details: aquatics progression  Person educated: Patient Education method: Medical illustrator Education comprehension: verbalized understanding and returned demonstration   HOME EXERCISE PROGRAM: [id number]   ASSESSMENT:  CLINICAL IMPRESSION: Pt guarded initially with gait; moderate cues to relax shoulders while holding noodle for UE support.  More relaxed as session went on.  She had some difficulty balancing on noodle while LE suspended, straddling noodle.  Once in corner holding wall while suspended, pt reported back pain had dissipated. Goals are ongoing. Will benefit from skilled PT services in order to reduce pain and assist in return to optimal level of function.    OBJECTIVE IMPAIRMENTS Abnormal gait, difficulty walking, decreased ROM, decreased strength, hypomobility, increased fascial restrictions, increased muscle spasms, impaired flexibility, impaired sensation, improper body mechanics, postural dysfunction, obesity, and pain.   ACTIVITY LIMITATIONS carrying, lifting, bending, sitting, standing, squatting, stairs, transfers, locomotion level, and caring for others  PARTICIPATION LIMITATIONS: driving, shopping, community activity, occupation, and yard work  PERSONAL FACTORS Age, Fitness, Past/current experiences, Profession, and Time since onset of injury/illness/exacerbation are also affecting patient's functional outcome.   REHAB POTENTIAL: Good  CLINICAL DECISION MAKING: Stable/uncomplicated  EVALUATION COMPLEXITY: Low   GOALS: Goals reviewed with patient? Yes  SHORT TERM GOALS: Target date: 09/13/2021  Will be compliant with appropriate progressive HEP  Baseline: Goal status: INITIAL  2.  Pain to be no more than 4/10 at worst and sciatic pain with have improved by 50% in intensity  Baseline:  Goal status: INITIAL  3.  Will have better understanding of posture and biomechanics   Baseline:  Goal status: INITIAL  4.  Will be able to climb flight of step with U rail and step to pattern without increase in pain  Baseline:  Goal status: INITIAL    LONG TERM GOALS: Target date: 10/11/2021  MMT to be 5/5 globally  Baseline:  Goal status: INITIAL  2.  Pain to be no more than 2/10 at worst with functional task performance  Baseline:  Goal status: INITIAL  3.  Will be able to ascend/descent full flight of steps with no rails and reciprocal pattern, no increase in pain  Baseline:  Goal status: INITIAL  4.  Will demonstrate ability to perform mock/simulated patient transfers with good mechanics in order to assist in preventing injury at her job  Baseline:  Goal status: INITIAL     PLAN: PT FREQUENCY: 2x/week  PT DURATION: 8 weeks  PLANNED INTERVENTIONS: Therapeutic exercises, Therapeutic activity, Neuromuscular re-education, Balance training, Gait training, Patient/Family education, Self Care, Joint mobilization, Stair training, DME instructions, Aquatic Therapy, Dry Needling, Electrical stimulation, Spinal mobilization, Cryotherapy, Moist heat, Taping, Traction, Ultrasound, Ionotophoresis 4mg /ml Dexamethasone, Manual therapy, and Re-evaluation.  PLAN FOR NEXT SESSION: focus on extension based program, otherwise postural training and core strengthening, biomechanics training   , PTA 08/23/21 1:29 PM

## 2021-08-25 ENCOUNTER — Ambulatory Visit (HOSPITAL_BASED_OUTPATIENT_CLINIC_OR_DEPARTMENT_OTHER): Payer: Commercial Managed Care - HMO | Admitting: Physical Therapy

## 2021-08-25 ENCOUNTER — Encounter (HOSPITAL_BASED_OUTPATIENT_CLINIC_OR_DEPARTMENT_OTHER): Payer: Self-pay

## 2021-08-29 ENCOUNTER — Other Ambulatory Visit: Payer: Self-pay | Admitting: Surgery

## 2021-08-31 ENCOUNTER — Ambulatory Visit (HOSPITAL_BASED_OUTPATIENT_CLINIC_OR_DEPARTMENT_OTHER): Payer: Commercial Managed Care - HMO | Admitting: Physical Therapy

## 2021-08-31 ENCOUNTER — Encounter (HOSPITAL_BASED_OUTPATIENT_CLINIC_OR_DEPARTMENT_OTHER): Payer: Self-pay | Admitting: Physical Therapy

## 2021-08-31 DIAGNOSIS — M5459 Other low back pain: Secondary | ICD-10-CM

## 2021-08-31 DIAGNOSIS — M6281 Muscle weakness (generalized): Secondary | ICD-10-CM

## 2021-08-31 DIAGNOSIS — R29898 Other symptoms and signs involving the musculoskeletal system: Secondary | ICD-10-CM

## 2021-08-31 DIAGNOSIS — M5451 Vertebrogenic low back pain: Secondary | ICD-10-CM | POA: Diagnosis not present

## 2021-08-31 NOTE — Therapy (Signed)
OUTPATIENT PHYSICAL THERAPY THORACOLUMBAR TREATMENT NOTE    Patient Name: Veronica Gonzales MRN: [id number] DOB:1970-11-20, 51 y.o., female Today's Date: 08/31/2021   PT End of Session - 08/31/21 0850     Visit Number 4    Number of Visits 17    Date for PT Re-Evaluation 10/11/21    Authorization Time Period 08/16/21 to 10/11/21    PT Start Time 0843    PT Stop Time 0907    PT Time Calculation (min) 24 min    Activity Tolerance Patient tolerated treatment well    Behavior During Therapy [facility] for tasks assessed/performed              History reviewed. No pertinent past medical history. History reviewed. No pertinent surgical history. There are no problems to display for this patient.   PCP: does not currently have   REFERRING PROVIDER: Venita Lick, MD   REFERRING DIAG: M54.51 (ICD-10-CM) - Vertebrogenic low back pain   Rationale for Evaluation and Treatment Rehabilitation  THERAPY DIAG:  Other low back pain  Muscle weakness (generalized)  Other symptoms and signs involving the musculoskeletal system  ONSET DATE: 07/28/2021   SUBJECTIVE:                                                                                                                                                                                          SUBJECTIVE STATEMENT:  Pt reports she woke up with increased pain, "It feels more like an injury".   She states that her family is visiting, so she has been moving around more over last few days.   PERTINENT HISTORY:  MD Summary: [name] returns today for follow-up. Unfortunately 3 to 5 days after she left my office she contracted COVID and was unable to attend physical therapy. She states that she has done some self-directed exercises and is noted significant improvement.   At this point I have refilled her gabapentin as this is helping to relieve her pain and we will start formalized aquatic physical therapy. If she fails to improve or there is  worsening of her symptoms she knows to contact me I will be happy to see her back. Otherwise, she can follow-up with me on an as-needed basis.     PAIN:  Are you having pain? Yes: NPRS scale: 8/10, Pain location: Lt low back Pain description: combo of nerve pain, dull throbbing  Aggravating factors: sitting in recliner, transitions  Relieving factors: laying on the floor, rest    PRECAUTIONS: None  WEIGHT BEARING RESTRICTIONS No  FALLS:  Has patient fallen in last 6 months? No  LIVING ENVIRONMENT: Lives with: lives alone  Lives in: House/apartment Stairs: Yes: Internal: 16 steps; on right going up Has following equipment at home: None  OCCUPATION: nurse aide   PLOF: Independent, Independent with basic ADLs, Independent with gait, and Independent with transfers  PATIENT GOALS get pain down, be able to get upstairs easier, build endurance back up    OBJECTIVE:   DIAGNOSTIC FINDINGS:    PATIENT SURVEYS:  FOTO will do next session  SCREENING FOR RED FLAGS: Bowel or bladder incontinence: No Spinal tumors: No Cauda equina syndrome: No Compression fracture: No Abdominal aneurysm: No  COGNITION:  Overall cognitive status: Within functional limits for tasks assessed     SENSATION: Not tested  MUSCLE LENGTH: Hamstrings WNL R, moderate limitation L  Piriformis WNL R, moderate limitation L  POSTURE: rounded shoulders, forward head, increased thoracic kyphosis, and flexed trunk   PALPATION: Lumbar and thoracic paraspinals tight but not TTP, no areas excessively tight or sore in B glutes or piriformis groups   LUMBAR ROM:   Active  A/PROM  eval  Flexion Mild limitation, RFIS no change in pain  Extension WNL, REIS improvement in pain   Right lateral flexion Moderate limitation   Left lateral flexion Moderate limitation   Right rotation   Left rotation    (Blank rows = not tested)  LOWER EXTREMITY ROM:     Active  Right eval Left eval  Hip flexion    Hip  extension    Hip abduction    Hip adduction    Hip internal rotation    Hip external rotation    Knee flexion    Knee extension    Ankle dorsiflexion    Ankle plantarflexion    Ankle inversion    Ankle eversion     (Blank rows = not tested)  LOWER EXTREMITY MMT:    MMT Right eval Left eval  Hip flexion 4 4  Hip extension 3+ 3+  Hip abduction 4+ 4+  Hip adduction    Hip internal rotation    Hip external rotation    Knee flexion 4+ 4  Knee extension 4+ 4+  Ankle dorsiflexion 5 5  Ankle plantarflexion    Ankle inversion    Ankle eversion     (Blank rows = not tested)  LUMBAR SPECIAL TESTS:    FUNCTIONAL TESTS:    GAIT: Distance walked: in clinic distances  Assistive device utilized: None Level of assistance: Complete Independence Comments: antalgic, limited hip rotation, flexed at hips, holds trunk very rigid     TODAY'S TREATMENT  Pt seen for aquatic therapy today.  Treatment took place in water 3.25-46ft 8" in depth at the [facility] pool. Temp of water was 91.  Pt entered/exited the pool via stairs independently with bilat rail.  * without support:  forward/ backward gait;  side stepping with arms abdct/add;  * Lt side stretch holding wall * at bench in water:  plank with hip ext x 10;  STS with cues for form x 5 * holding wall:  squats x 10;  hip openers; * stretches at stairs:  quad stretch with foot on 2nd step, hamstring stretch with foot on 2nd step; fig 4 stretch holding rails; L stretch holding rails   * straddling yellow noodle and holding wall in corner, cycling and hip abdct/add - multiple circuits  Pt requires the buoyancy and hydrostatic pressure of water for support, and to offload joints by unweighting joint load by at least 50 % in navel deep water and by at  least 75-80% in chest to neck deep water.  Viscosity of the water is needed for resistance of strengthening. Water current perturbations provides challenge to standing balance  requiring increased core activation.     PATIENT EDUCATION:  Education details: aquatics progression  Person educated: Patient Education method: Medical illustrator Education comprehension: verbalized understanding and returned demonstration   HOME EXERCISE PROGRAM: [id number]   ASSESSMENT:  CLINICAL IMPRESSION: Pt session shortened due to late arrival (mix up of appt time).  She tolerated exercises well and reported reduction of pain to 4/10 by end of session.  Goals are ongoing. Will benefit from skilled PT services in order to reduce pain and assist in return to optimal level of function.    OBJECTIVE IMPAIRMENTS Abnormal gait, difficulty walking, decreased ROM, decreased strength, hypomobility, increased fascial restrictions, increased muscle spasms, impaired flexibility, impaired sensation, improper body mechanics, postural dysfunction, obesity, and pain.   ACTIVITY LIMITATIONS carrying, lifting, bending, sitting, standing, squatting, stairs, transfers, locomotion level, and caring for others  PARTICIPATION LIMITATIONS: driving, shopping, community activity, occupation, and yard work  PERSONAL FACTORS Age, Fitness, Past/current experiences, Profession, and Time since onset of injury/illness/exacerbation are also affecting patient's functional outcome.   REHAB POTENTIAL: Good  CLINICAL DECISION MAKING: Stable/uncomplicated  EVALUATION COMPLEXITY: Low   GOALS: Goals reviewed with patient? Yes  SHORT TERM GOALS: Target date: 09/13/2021  Will be compliant with appropriate progressive HEP  Baseline: Goal status: INITIAL  2.  Pain to be no more than 4/10 at worst and sciatic pain with have improved by 50% in intensity  Baseline:  Goal status: INITIAL  3.  Will have better understanding of posture and biomechanics  Baseline:  Goal status: INITIAL  4.  Will be able to climb flight of step with U rail and step to pattern without increase in pain  Baseline:   Goal status: INITIAL    LONG TERM GOALS: Target date: 10/11/2021  MMT to be 5/5 globally  Baseline:  Goal status: INITIAL  2.  Pain to be no more than 2/10 at worst with functional task performance  Baseline:  Goal status: INITIAL  3.  Will be able to ascend/descent full flight of steps with no rails and reciprocal pattern, no increase in pain  Baseline:  Goal status: INITIAL  4.  Will demonstrate ability to perform mock/simulated patient transfers with good mechanics in order to assist in preventing injury at her job  Baseline:  Goal status: INITIAL     PLAN: PT FREQUENCY: 2x/week  PT DURATION: 8 weeks  PLANNED INTERVENTIONS: Therapeutic exercises, Therapeutic activity, Neuromuscular re-education, Balance training, Gait training, Patient/Family education, Self Care, Joint mobilization, Stair training, DME instructions, Aquatic Therapy, Dry Needling, Electrical stimulation, Spinal mobilization, Cryotherapy, Moist heat, Taping, Traction, Ultrasound, Ionotophoresis 4mg /ml Dexamethasone, Manual therapy, and Re-evaluation.  PLAN FOR NEXT SESSION: focus on extension based program, otherwise postural training and core strengthening, biomechanics training   , PTA 08/31/21 10:14 AM

## 2021-09-01 ENCOUNTER — Encounter (HOSPITAL_BASED_OUTPATIENT_CLINIC_OR_DEPARTMENT_OTHER): Payer: Self-pay | Admitting: Physical Therapy

## 2021-09-01 ENCOUNTER — Ambulatory Visit (HOSPITAL_BASED_OUTPATIENT_CLINIC_OR_DEPARTMENT_OTHER): Payer: Commercial Managed Care - HMO | Admitting: Physical Therapy

## 2021-09-01 DIAGNOSIS — M5451 Vertebrogenic low back pain: Secondary | ICD-10-CM | POA: Diagnosis not present

## 2021-09-01 DIAGNOSIS — R29898 Other symptoms and signs involving the musculoskeletal system: Secondary | ICD-10-CM

## 2021-09-01 DIAGNOSIS — M5459 Other low back pain: Secondary | ICD-10-CM

## 2021-09-01 DIAGNOSIS — M6281 Muscle weakness (generalized): Secondary | ICD-10-CM

## 2021-09-01 DIAGNOSIS — R208 Other disturbances of skin sensation: Secondary | ICD-10-CM

## 2021-09-01 NOTE — Therapy (Signed)
OUTPATIENT PHYSICAL THERAPY THORACOLUMBAR TREATMENT NOTE    Patient Name: Veronica Gonzales MRN: [id number] DOB:11/05/1970, 51 y.o., female Today's Date: 09/01/2021   PT End of Session - 09/01/21 1540     Visit Number 5    Number of Visits 17    Date for PT Re-Evaluation 10/11/21    Authorization Time Period 08/16/21 to 10/11/21    PT Start Time 1533    PT Stop Time 1611    PT Time Calculation (min) 38 min    Behavior During Therapy [facility] for tasks assessed/performed              History reviewed. No pertinent past medical history. History reviewed. No pertinent surgical history. There are no problems to display for this patient.   PCP: does not currently have   REFERRING PROVIDER: Venita Lick, MD   REFERRING DIAG: M54.51 (ICD-10-CM) - Vertebrogenic low back pain   Rationale for Evaluation and Treatment Rehabilitation  THERAPY DIAG:  Other low back pain  Muscle weakness (generalized)  Other symptoms and signs involving the musculoskeletal system  Other disturbances of skin sensation  ONSET DATE: 07/28/2021   SUBJECTIVE:                                                                                                                                                                                          SUBJECTIVE STATEMENT:  Pt reports she used icy hot at work last night.  Pain is less today.   PERTINENT HISTORY:  MD Summary: Veronica Gonzales returns today for follow-up. Unfortunately 3 to 5 days after she left my office she contracted COVID and was unable to attend physical therapy. She states that she has done some self-directed exercises and is noted significant improvement.   At this point I have refilled her gabapentin as this is helping to relieve her pain and we will start formalized aquatic physical therapy. If she fails to improve or there is worsening of her symptoms she knows to contact me I will be happy to see her back. Otherwise, she can follow-up with me on  an as-needed basis.     PAIN:  Are you having pain? Yes: NPRS scale: 3/10, Pain location: Lt low back Pain description: combo of nerve pain, dull throbbing  Aggravating factors: sitting in recliner, transitions  Relieving factors: laying on the floor, rest    PRECAUTIONS: None  WEIGHT BEARING RESTRICTIONS No  FALLS:  Has patient fallen in last 6 months? No  LIVING ENVIRONMENT: Lives with: lives alone Lives in: House/apartment Stairs: Yes: Internal: 16 steps; on right going up Has following equipment at home: None  OCCUPATION: nurse aide  PLOF: Independent, Independent with basic ADLs, Independent with gait, and Independent with transfers  PATIENT GOALS get pain down, be able to get upstairs easier, build endurance back up    OBJECTIVE:   DIAGNOSTIC FINDINGS:    PATIENT SURVEYS:  FOTO will do next session  SCREENING FOR RED FLAGS: Bowel or bladder incontinence: No Spinal tumors: No Cauda equina syndrome: No Compression fracture: No Abdominal aneurysm: No  COGNITION:  Overall cognitive status: Within functional limits for tasks assessed     SENSATION: Not tested  MUSCLE LENGTH: Hamstrings WNL R, moderate limitation L  Piriformis WNL R, moderate limitation L  POSTURE: rounded shoulders, forward head, increased thoracic kyphosis, and flexed trunk   PALPATION: Lumbar and thoracic paraspinals tight but not TTP, no areas excessively tight or sore in B glutes or piriformis groups   LUMBAR ROM:   Active  A/PROM  eval  Flexion Mild limitation, RFIS no change in pain  Extension WNL, REIS improvement in pain   Right lateral flexion Moderate limitation   Left lateral flexion Moderate limitation   Right rotation   Left rotation    (Blank rows = not tested)  LOWER EXTREMITY ROM:     Active  Right eval Left eval  Hip flexion    Hip extension    Hip abduction    Hip adduction    Hip internal rotation    Hip external rotation    Knee flexion    Knee  extension    Ankle dorsiflexion    Ankle plantarflexion    Ankle inversion    Ankle eversion     (Blank rows = not tested)  LOWER EXTREMITY MMT:    MMT Right eval Left eval  Hip flexion 4 4  Hip extension 3+ 3+  Hip abduction 4+ 4+  Hip adduction    Hip internal rotation    Hip external rotation    Knee flexion 4+ 4  Knee extension 4+ 4+  Ankle dorsiflexion 5 5  Ankle plantarflexion    Ankle inversion    Ankle eversion     (Blank rows = not tested)  LUMBAR SPECIAL TESTS:    FUNCTIONAL TESTS:    GAIT: Distance walked: in clinic distances  Assistive device utilized: None Level of assistance: Complete Independence Comments: antalgic, limited hip rotation, flexed at hips, holds trunk very rigid     TODAY'S TREATMENT  Pt seen for aquatic therapy today.  Treatment took place in water 3.25-47ft 8" in depth at the [facility] pool. Temp of water was 91.  Pt entered/exited the pool via stairs independently with bilat rail.  * holding white barbell:  forward/ backward gait;  side stepping  * forward step ups on blue step at 53ft 6" x 5 LLE, x 5 RLE x 2 sets (no UE support)  * at bench in water:   STS with feet on blue step x 10; plank with hip ext x 10;  * holding wall:  hip openers/ hip crosses; curtsy lunges * blue noodle pull downs to thighs with ab set; holding blue noodle at surface-side step squat * stretches at stairs:  quad stretch with foot on 2nd step, hamstring stretch with foot on 2nd step; fig 4 stretch holding rails; L stretch holding rails; calf stretch   Pt requires the buoyancy and hydrostatic pressure of water for support, and to offload joints by unweighting joint load by at least 50 % in navel deep water and by at least 75-80% in chest to  neck deep water.  Viscosity of the water is needed for resistance of strengthening. Water current perturbations provides challenge to standing balance requiring increased core activation.  PATIENT EDUCATION:   Education details: aquatics progression  Person educated: Patient Education method: Medical illustrator Education comprehension: verbalized understanding and returned demonstration   HOME EXERCISE PROGRAM: [id number]   ASSESSMENT:  CLINICAL IMPRESSION:  Positive response to aquatic exercises thus far with over reduction in pain. Good relief of symptoms when sitting on noodle in deeper water.   Pt tolerated step ups in 3.5 ft of water; no increase in pain.  Improved mechanics with sit to/from stand. Will benefit from skilled PT services in order to reduce pain and assist in return to optimal level of function.    OBJECTIVE IMPAIRMENTS Abnormal gait, difficulty walking, decreased ROM, decreased strength, hypomobility, increased fascial restrictions, increased muscle spasms, impaired flexibility, impaired sensation, improper body mechanics, postural dysfunction, obesity, and pain.   ACTIVITY LIMITATIONS carrying, lifting, bending, sitting, standing, squatting, stairs, transfers, locomotion level, and caring for others  PARTICIPATION LIMITATIONS: driving, shopping, community activity, occupation, and yard work  PERSONAL FACTORS Age, Fitness, Past/current experiences, Profession, and Time since onset of injury/illness/exacerbation are also affecting patient's functional outcome.   REHAB POTENTIAL: Good  CLINICAL DECISION MAKING: Stable/uncomplicated  EVALUATION COMPLEXITY: Low   GOALS: Goals reviewed with patient? Yes  SHORT TERM GOALS: Target date: 09/13/2021  Will be compliant with appropriate progressive HEP  Baseline: Goal status: INITIAL  2.  Pain to be no more than 4/10 at worst and sciatic pain with have improved by 50% in intensity  Baseline:  Goal status: INITIAL  3.  Will have better understanding of posture and biomechanics  Baseline:  Goal status: INITIAL  4.  Will be able to climb flight of step with U rail and step to pattern without increase in pain   Baseline:  Goal status: INITIAL    LONG TERM GOALS: Target date: 10/11/2021  MMT to be 5/5 globally  Baseline:  Goal status: INITIAL  2.  Pain to be no more than 2/10 at worst with functional task performance  Baseline:  Goal status: INITIAL  3.  Will be able to ascend/descent full flight of steps with no rails and reciprocal pattern, no increase in pain  Baseline:  Goal status: INITIAL  4.  Will demonstrate ability to perform mock/simulated patient transfers with good mechanics in order to assist in preventing injury at her job  Baseline:  Goal status: INITIAL     PLAN: PT FREQUENCY: 2x/week  PT DURATION: 8 weeks  PLANNED INTERVENTIONS: Therapeutic exercises, Therapeutic activity, Neuromuscular re-education, Balance training, Gait training, Patient/Family education, Self Care, Joint mobilization, Stair training, DME instructions, Aquatic Therapy, Dry Needling, Electrical stimulation, Spinal mobilization, Cryotherapy, Moist heat, Taping, Traction, Ultrasound, Ionotophoresis 4mg /ml Dexamethasone, Manual therapy, and Re-evaluation.  PLAN FOR NEXT SESSION: focus on extension based program, otherwise postural training and core strengthening, biomechanics training   , PTA 09/01/21 4:13 PM

## 2021-09-05 ENCOUNTER — Ambulatory Visit (HOSPITAL_BASED_OUTPATIENT_CLINIC_OR_DEPARTMENT_OTHER): Payer: Commercial Managed Care - HMO | Admitting: Physical Therapy

## 2021-09-05 ENCOUNTER — Encounter (HOSPITAL_BASED_OUTPATIENT_CLINIC_OR_DEPARTMENT_OTHER): Payer: Self-pay

## 2021-09-08 ENCOUNTER — Ambulatory Visit (HOSPITAL_BASED_OUTPATIENT_CLINIC_OR_DEPARTMENT_OTHER): Payer: Commercial Managed Care - HMO | Admitting: Physical Therapy

## 2021-09-08 ENCOUNTER — Encounter (HOSPITAL_BASED_OUTPATIENT_CLINIC_OR_DEPARTMENT_OTHER): Payer: Self-pay | Admitting: Physical Therapy

## 2021-09-08 DIAGNOSIS — M5451 Vertebrogenic low back pain: Secondary | ICD-10-CM | POA: Diagnosis not present

## 2021-09-08 DIAGNOSIS — R29898 Other symptoms and signs involving the musculoskeletal system: Secondary | ICD-10-CM

## 2021-09-08 DIAGNOSIS — M6281 Muscle weakness (generalized): Secondary | ICD-10-CM

## 2021-09-08 DIAGNOSIS — R208 Other disturbances of skin sensation: Secondary | ICD-10-CM

## 2021-09-08 DIAGNOSIS — M5459 Other low back pain: Secondary | ICD-10-CM

## 2021-09-08 NOTE — Therapy (Signed)
OUTPATIENT PHYSICAL THERAPY THORACOLUMBAR TREATMENT NOTE    Patient Name: Alazne Quant MRN: [id number] DOB:May 30, 1970, 51 y.o., female Today's Date: 09/08/2021   PT End of Session - 09/08/21 0944     Visit Number 6    Number of Visits 17    Date for PT Re-Evaluation 10/11/21    Authorization Time Period 08/16/21 to 10/11/21    PT Start Time 0948    PT Stop Time 1030    PT Time Calculation (min) 42 min    Activity Tolerance Patient tolerated treatment well    Behavior During Therapy [facility] for tasks assessed/performed               History reviewed. No pertinent past medical history. History reviewed. No pertinent surgical history. There are no problems to display for this patient.   PCP: does not currently have   REFERRING PROVIDER: Venita Lick, MD   REFERRING DIAG: M54.51 (ICD-10-CM) - Vertebrogenic low back pain   Rationale for Evaluation and Treatment Rehabilitation  THERAPY DIAG:  Other low back pain  Muscle weakness (generalized)  Other symptoms and signs involving the musculoskeletal system  Other disturbances of skin sensation  ONSET DATE: 07/28/2021   SUBJECTIVE:                                                                                                                                                                                          SUBJECTIVE STATEMENT:  "Missed last appointment and I am really hurting today.  My pain has been decreasing for 1-2 days after the therapy"  PERTINENT HISTORY:  MD Summary: Ayaat returns today for follow-up. Unfortunately 3 to 5 days after she left my office she contracted COVID and was unable to attend physical therapy. She states that she has done some self-directed exercises and is noted significant improvement.   At this point I have refilled her gabapentin as this is helping to relieve her pain and we will start formalized aquatic physical therapy. If she fails to improve or there is worsening of her  symptoms she knows to contact me I will be happy to see her back. Otherwise, she can follow-up with me on an as-needed basis.     PAIN:  Are you having pain? Yes: NPRS scale: 4/10, Pain location: Lt low back with radiation to left knee Pain description: combo of nerve pain, dull throbbing  Aggravating factors: sitting in recliner, transitions  Relieving factors: laying on the floor, rest    PRECAUTIONS: None  WEIGHT BEARING RESTRICTIONS No  FALLS:  Has patient fallen in last 6 months? No  LIVING ENVIRONMENT: Lives with: lives alone Lives in:  House/apartment Stairs: Yes: Internal: 16 steps; on right going up Has following equipment at home: None  OCCUPATION: nurse aide   PLOF: Independent, Independent with basic ADLs, Independent with gait, and Independent with transfers  PATIENT GOALS get pain down, be able to get upstairs easier, build endurance back up    OBJECTIVE:   DIAGNOSTIC FINDINGS:    PATIENT SURVEYS:  FOTO will do next session  SCREENING FOR RED FLAGS: Bowel or bladder incontinence: No Spinal tumors: No Cauda equina syndrome: No Compression fracture: No Abdominal aneurysm: No  COGNITION:  Overall cognitive status: Within functional limits for tasks assessed     SENSATION: Not tested  MUSCLE LENGTH: Hamstrings WNL R, moderate limitation L  Piriformis WNL R, moderate limitation L  POSTURE: rounded shoulders, forward head, increased thoracic kyphosis, and flexed trunk   PALPATION: Lumbar and thoracic paraspinals tight but not TTP, no areas excessively tight or sore in B glutes or piriformis groups   LUMBAR ROM:   Active  A/PROM  eval  Flexion Mild limitation, RFIS no change in pain  Extension WNL, REIS improvement in pain   Right lateral flexion Moderate limitation   Left lateral flexion Moderate limitation   Right rotation   Left rotation    (Blank rows = not tested)  LOWER EXTREMITY ROM:     Active  Right eval Left eval  Hip  flexion    Hip extension    Hip abduction    Hip adduction    Hip internal rotation    Hip external rotation    Knee flexion    Knee extension    Ankle dorsiflexion    Ankle plantarflexion    Ankle inversion    Ankle eversion     (Blank rows = not tested)  LOWER EXTREMITY MMT:    MMT Right eval Left eval  Hip flexion 4 4  Hip extension 3+ 3+  Hip abduction 4+ 4+  Hip adduction    Hip internal rotation    Hip external rotation    Knee flexion 4+ 4  Knee extension 4+ 4+  Ankle dorsiflexion 5 5  Ankle plantarflexion    Ankle inversion    Ankle eversion     (Blank rows = not tested)  LUMBAR SPECIAL TESTS:    FUNCTIONAL TESTS:    GAIT: Distance walked: in clinic distances  Assistive device utilized: None Level of assistance: Complete Independence Comments: antalgic, limited hip rotation, flexed at hips, holds trunk very rigid     TODAY'S TREATMENT  Pt seen for aquatic therapy today.  Treatment took place in water 3.25-67ft 8" in depth at the [facility] pool. Temp of water was 91.  Pt entered/exited the pool via stairs independently with bilat rail.  * holding white barbell:  forward/ backward gait;  side stepping  *Tandem stepping forward and back * holding white barbell:  hip openers/ hip crosses; curtsy lunges * forward step ups on bottom step at 29ft 6" x 10 (no UE support)  *TKE on blue step (CKC) x 10 R/L * at bench in water:   STS with feet on blue step x 10; Reviewed biomechanics of rising from chair. *Squats on blue step holding to wall  * stretches at stairs:  quad stretch with foot on 2nd step, hamstring stretch with foot on 2nd step; L stretch holding rails; calf stretch   Pt requires the buoyancy and hydrostatic pressure of water for support, and to offload joints by unweighting joint load by at least  50 % in navel deep water and by at least 75-80% in chest to neck deep water.  Viscosity of the water is needed for resistance of strengthening.  Water current perturbations provides challenge to standing balance requiring increased core activation.  PATIENT EDUCATION:  Education details: aquatics progression  Person educated: Patient Education method: Dance movement psychotherapist comprehension: verbalized understanding and returned demonstration   HOME EXERCISE PROGRAM: [id number]   ASSESSMENT:  CLINICAL IMPRESSION:  Pt climbing entire flight of steps using step to pattern but without rest period and she reports walking ~ 1/4 mile around a building  which she noticed without rest period or increase in pain.  She is pleased so far with results.  States she feels she is most limited  in strength in RLE.  She reports compliance with stretching/HEP daily. Pt tolerates increased core engagement and LE strengthening with exercises today well and without increased pain.  Sciatica pain resolves quickly once submerged, LBP continues but reduced    OBJECTIVE IMPAIRMENTS Abnormal gait, difficulty walking, decreased ROM, decreased strength, hypomobility, increased fascial restrictions, increased muscle spasms, impaired flexibility, impaired sensation, improper body mechanics, postural dysfunction, obesity, and pain.   ACTIVITY LIMITATIONS carrying, lifting, bending, sitting, standing, squatting, stairs, transfers, locomotion level, and caring for others  PARTICIPATION LIMITATIONS: driving, shopping, community activity, occupation, and yard work  PERSONAL FACTORS Age, Fitness, Past/current experiences, Profession, and Time since onset of injury/illness/exacerbation are also affecting patient's functional outcome.   REHAB POTENTIAL: Good  CLINICAL DECISION MAKING: Stable/uncomplicated  EVALUATION COMPLEXITY: Low   GOALS: Goals reviewed with patient? Yes  SHORT TERM GOALS: Target date: 09/13/2021  Will be compliant with appropriate progressive HEP  Baseline: Goal status: Progessing  2.  Pain to be no more than 4/10 at worst  and sciatic pain with have improved by 50% in intensity  Baseline:  Goal status: INITIAL  3.  Will have better understanding of posture and biomechanics  Baseline:  Goal status: INITIAL  4.  Will be able to climb flight of step with U rail and step to pattern without increase in pain  Baseline:  Goal status: progressing    LONG TERM GOALS: Target date: 10/11/2021  MMT to be 5/5 globally  Baseline:  Goal status: INITIAL  2.  Pain to be no more than 2/10 at worst with functional task performance  Baseline:  Goal status: INITIAL  3.  Will be able to ascend/descent full flight of steps with no rails and reciprocal pattern, no increase in pain  Baseline:  Goal status: INITIAL  4.  Will demonstrate ability to perform mock/simulated patient transfers with good mechanics in order to assist in preventing injury at her job  Baseline:  Goal status: INITIAL     PLAN: PT FREQUENCY: 2x/week  PT DURATION: 8 weeks  PLANNED INTERVENTIONS: Therapeutic exercises, Therapeutic activity, Neuromuscular re-education, Balance training, Gait training, Patient/Family education, Self Care, Joint mobilization, Stair training, DME instructions, Aquatic Therapy, Dry Needling, Electrical stimulation, Spinal mobilization, Cryotherapy, Moist heat, Taping, Traction, Ultrasound, Ionotophoresis 4mg /ml Dexamethasone, Manual therapy, and Re-evaluation.  PLAN FOR NEXT SESSION: focus on extension based program, otherwise postural training and core strengthening, biomechanics training   Stanton Kidney Tharon Aquas) Mykaela Arena MPT 09/08/21 9:48 AM

## 2021-09-15 ENCOUNTER — Encounter (HOSPITAL_BASED_OUTPATIENT_CLINIC_OR_DEPARTMENT_OTHER): Payer: Self-pay | Admitting: Physical Therapy

## 2021-09-15 ENCOUNTER — Ambulatory Visit (HOSPITAL_BASED_OUTPATIENT_CLINIC_OR_DEPARTMENT_OTHER): Payer: Commercial Managed Care - HMO | Attending: Orthopedic Surgery | Admitting: Physical Therapy

## 2021-09-15 DIAGNOSIS — M6281 Muscle weakness (generalized): Secondary | ICD-10-CM | POA: Insufficient documentation

## 2021-09-15 DIAGNOSIS — M5459 Other low back pain: Secondary | ICD-10-CM | POA: Insufficient documentation

## 2021-09-15 DIAGNOSIS — R29898 Other symptoms and signs involving the musculoskeletal system: Secondary | ICD-10-CM | POA: Diagnosis present

## 2021-09-15 NOTE — Therapy (Signed)
OUTPATIENT PHYSICAL THERAPY THORACOLUMBAR TREATMENT NOTE    Patient Name: Veronica Gonzales MRN: [id number] DOB:Aug 19, 1970, 51 y.o., female Today's Date: 09/15/2021   PT End of Session - 09/15/21 1405     Visit Number 7    Number of Visits 17    Date for PT Re-Evaluation 10/11/21    Authorization Time Period 08/16/21 to 10/11/21    PT Start Time 1400    PT Stop Time 1441    PT Time Calculation (min) 41 min    Activity Tolerance Patient tolerated treatment well    Behavior During Therapy [facility] for tasks assessed/performed               History reviewed. No pertinent past medical history. History reviewed. No pertinent surgical history. There are no problems to display for this patient.   PCP: does not currently have   REFERRING PROVIDER: Venita Lick, MD   REFERRING DIAG: M54.51 (ICD-10-CM) - Vertebrogenic low back pain   Rationale for Evaluation and Treatment Rehabilitation  THERAPY DIAG:  Other low back pain  Muscle weakness (generalized)  Other symptoms and signs involving the musculoskeletal system  ONSET DATE: 07/28/2021   SUBJECTIVE:                                                                                                                                                                                          SUBJECTIVE STATEMENT:  Pt reports 3 days ago she slept for 12 hrs straight and awoke with 8/10 pain. She has iced, stretched, and took muscle relaxer; pain is finally decreasing.   PERTINENT HISTORY:  MD Summary: Veronica Gonzales returns today for follow-up. Unfortunately 3 to 5 days after she left my office she contracted COVID and was unable to attend physical therapy. She states that she has done some self-directed exercises and is noted significant improvement.   At this point I have refilled her gabapentin as this is helping to relieve her pain and we will start formalized aquatic physical therapy. If she fails to improve or there is worsening of her  symptoms she knows to contact me I will be happy to see her back. Otherwise, she can follow-up with me on an as-needed basis.     PAIN:  Are you having pain? Yes: NPRS scale: 3/10, Pain location: Lt low back with radiation to left hip Pain description: combo of nerve pain, dull throbbing  Aggravating factors: sitting in recliner, transitions  Relieving factors: laying on the floor, rest    PRECAUTIONS: None  WEIGHT BEARING RESTRICTIONS No  FALLS:  Has patient fallen in last 6 months? No  LIVING ENVIRONMENT: Lives with: lives alone  Lives in: House/apartment Stairs: Yes: Internal: 16 steps; on right going up Has following equipment at home: None  OCCUPATION: nurse aide   PLOF: Independent, Independent with basic ADLs, Independent with gait, and Independent with transfers  PATIENT GOALS get pain down, be able to get upstairs easier, build endurance back up    OBJECTIVE:   DIAGNOSTIC FINDINGS:    PATIENT SURVEYS:  FOTO will do next session  SCREENING FOR RED FLAGS: Bowel or bladder incontinence: No Spinal tumors: No Cauda equina syndrome: No Compression fracture: No Abdominal aneurysm: No  COGNITION:  Overall cognitive status: Within functional limits for tasks assessed     SENSATION: Not tested  MUSCLE LENGTH: Hamstrings WNL R, moderate limitation L  Piriformis WNL R, moderate limitation L  POSTURE: rounded shoulders, forward head, increased thoracic kyphosis, and flexed trunk   PALPATION: Lumbar and thoracic paraspinals tight but not TTP, no areas excessively tight or sore in B glutes or piriformis groups   LUMBAR ROM:   Active  A/PROM  eval  Flexion Mild limitation, RFIS no change in pain  Extension WNL, REIS improvement in pain   Right lateral flexion Moderate limitation   Left lateral flexion Moderate limitation   Right rotation   Left rotation    (Blank rows = not tested)  LOWER EXTREMITY ROM:     Active  Right eval Left eval  Hip flexion     Hip extension    Hip abduction    Hip adduction    Hip internal rotation    Hip external rotation    Knee flexion    Knee extension    Ankle dorsiflexion    Ankle plantarflexion    Ankle inversion    Ankle eversion     (Blank rows = not tested)  LOWER EXTREMITY MMT:    MMT Right eval Left eval  Hip flexion 4 4  Hip extension 3+ 3+  Hip abduction 4+ 4+  Hip adduction    Hip internal rotation    Hip external rotation    Knee flexion 4+ 4  Knee extension 4+ 4+  Ankle dorsiflexion 5 5  Ankle plantarflexion    Ankle inversion    Ankle eversion     (Blank rows = not tested)  LUMBAR SPECIAL TESTS:    FUNCTIONAL TESTS:    GAIT: Distance walked: in clinic distances  Assistive device utilized: None Level of assistance: Complete Independence Comments: antalgic, limited hip rotation, flexed at hips, holds trunk very rigid     TODAY'S TREATMENT  Pt seen for aquatic therapy today.  Treatment took place in water 3.25-2ft 8" in depth at the [facility] pool. Temp of water was 91.  Pt entered/exited the pool via stairs independently with bilat rail.  *  forward/ backward gait;  side stepping without support * holding yellow noodle -> wall: curtsy lunges;  hip openers/ hip crosses;  * straddling yellow noodle, holding yellow hand buoys:  cycling, light jumping jack with legs suspended * at stairs:  fig 4 stretch, hamstring stretch, L stretch * at bench in water:   STS with feet on blue step x 10 * lateral step ups (straddling blue step in shallow water) ; forward step ups - all with light UE on wall * trunk ext x 5 sec x 5 * plank with hands on bench, with hip ext, then with fire hydrants  *return to walking in deeper water for rest and recovery  Pt requires the buoyancy and hydrostatic pressure of  water for support, and to offload joints by unweighting joint load by at least 50 % in navel deep water and by at least 75-80% in chest to neck deep water.  Viscosity  of the water is needed for resistance of strengthening. Water current perturbations provides challenge to standing balance requiring increased core activation.  PATIENT EDUCATION:  Education details: aquatics progression  Person educated: Patient Education method: Medical illustrator Education comprehension: verbalized understanding and returned demonstration   HOME EXERCISE PROGRAM: [id number]   ASSESSMENT:  CLINICAL IMPRESSION: Pt with recent flare up of symptoms in L low back and Lt hip.  Pain decreased to 0/10 intermittently during aquatic session.  Lateral and forward step ups increased pain in Lt SI/glute region; trunk extension eased pain.  Goals are ongoing.      OBJECTIVE IMPAIRMENTS Abnormal gait, difficulty walking, decreased ROM, decreased strength, hypomobility, increased fascial restrictions, increased muscle spasms, impaired flexibility, impaired sensation, improper body mechanics, postural dysfunction, obesity, and pain.   ACTIVITY LIMITATIONS carrying, lifting, bending, sitting, standing, squatting, stairs, transfers, locomotion level, and caring for others  PARTICIPATION LIMITATIONS: driving, shopping, community activity, occupation, and yard work  PERSONAL FACTORS Age, Fitness, Past/current experiences, Profession, and Time since onset of injury/illness/exacerbation are also affecting patient's functional outcome.   REHAB POTENTIAL: Good  CLINICAL DECISION MAKING: Stable/uncomplicated  EVALUATION COMPLEXITY: Low   GOALS: Goals reviewed with patient? Yes  SHORT TERM GOALS: Target date: 09/13/2021  Will be compliant with appropriate progressive HEP  Baseline: Goal status: Progessing  2.  Pain to be no more than 4/10 at worst and sciatic pain with have improved by 50% in intensity  Baseline:  Goal status: Ongoing  3.  Will have better understanding of posture and biomechanics  Baseline:  Goal status: Ongoing   4.  Will be able to climb flight  of step with U rail and step to pattern without increase in pain  Baseline:  Goal status: progressing    LONG TERM GOALS: Target date: 10/11/2021  MMT to be 5/5 globally  Baseline:  Goal status: INITIAL  2.  Pain to be no more than 2/10 at worst with functional task performance  Baseline:  Goal status: INITIAL  3.  Will be able to ascend/descent full flight of steps with no rails and reciprocal pattern, no increase in pain  Baseline:  Goal status: INITIAL  4.  Will demonstrate ability to perform mock/simulated patient transfers with good mechanics in order to assist in preventing injury at her job  Baseline:  Goal status: INITIAL     PLAN: PT FREQUENCY: 2x/week  PT DURATION: 8 weeks  PLANNED INTERVENTIONS: Therapeutic exercises, Therapeutic activity, Neuromuscular re-education, Balance training, Gait training, Patient/Family education, Self Care, Joint mobilization, Stair training, DME instructions, Aquatic Therapy, Dry Needling, Electrical stimulation, Spinal mobilization, Cryotherapy, Moist heat, Taping, Traction, Ultrasound, Ionotophoresis 4mg /ml Dexamethasone, Manual therapy, and Re-evaluation.  PLAN FOR NEXT SESSION: focus on extension based program, otherwise postural training and core strengthening, biomechanics training   , PTA 09/15/21 2:41 PM

## 2021-09-19 ENCOUNTER — Encounter (HOSPITAL_BASED_OUTPATIENT_CLINIC_OR_DEPARTMENT_OTHER): Payer: Self-pay | Admitting: Physical Therapy

## 2021-09-19 ENCOUNTER — Ambulatory Visit (HOSPITAL_BASED_OUTPATIENT_CLINIC_OR_DEPARTMENT_OTHER): Payer: Commercial Managed Care - HMO | Admitting: Physical Therapy

## 2021-09-19 DIAGNOSIS — M5459 Other low back pain: Secondary | ICD-10-CM | POA: Diagnosis not present

## 2021-09-19 DIAGNOSIS — R29898 Other symptoms and signs involving the musculoskeletal system: Secondary | ICD-10-CM

## 2021-09-19 DIAGNOSIS — M6281 Muscle weakness (generalized): Secondary | ICD-10-CM

## 2021-09-19 NOTE — Therapy (Signed)
OUTPATIENT PHYSICAL THERAPY THORACOLUMBAR TREATMENT NOTE    Patient Name: Veronica Gonzales MRN: [id number] DOB:Oct 30, 1970, 51 y.o., female Today's Date: 09/19/2021   PT End of Session - 09/19/21 1041     Visit Number 8    Number of Visits 17    Date for PT Re-Evaluation 10/11/21    Authorization Time Period 08/16/21 to 10/11/21    PT Start Time 1033    PT Stop Time 1112    PT Time Calculation (min) 39 min    Activity Tolerance Patient limited by pain    Behavior During Therapy [facility] for tasks assessed/performed               History reviewed. No pertinent past medical history. History reviewed. No pertinent surgical history. There are no problems to display for this patient.   PCP: does not currently have   REFERRING PROVIDER: Venita Lick, MD   REFERRING DIAG: M54.51 (ICD-10-CM) - Vertebrogenic low back pain   Rationale for Evaluation and Treatment Rehabilitation  THERAPY DIAG:  Other low back pain  Muscle weakness (generalized)  Other symptoms and signs involving the musculoskeletal system  ONSET DATE: 07/28/2021   SUBJECTIVE:                                                                                                                                                                                          SUBJECTIVE STATEMENT:  Pt reports she worked 3 days - 12 hr shifts.  Pain in Lt hip/back increased; "It took me 45 min to get to my car because I had to stop and rest".   PERTINENT HISTORY:  MD Summary: Melodi returns today for follow-up. Unfortunately 3 to 5 days after she left my office she contracted COVID and was unable to attend physical therapy. She states that she has done some self-directed exercises and is noted significant improvement.   At this point I have refilled her gabapentin as this is helping to relieve her pain and we will start formalized aquatic physical therapy. If she fails to improve or there is worsening of her symptoms she knows to  contact me I will be happy to see her back. Otherwise, she can follow-up with me on an as-needed basis.     PAIN:  Are you having pain? Yes: NPRS scale: 8/10, Pain location: Lt low back with radiation to left hip Pain description: combo of nerve pain, dull throbbing  Aggravating factors: sitting in recliner, transitions  Relieving factors: laying on the floor, rest    PRECAUTIONS: None  WEIGHT BEARING RESTRICTIONS No  FALLS:  Has patient fallen in last 6 months? No  LIVING  ENVIRONMENT: Lives with: lives alone Lives in: House/apartment Stairs: Yes: Internal: 16 steps; on right going up Has following equipment at home: None  OCCUPATION: nurse aide   PLOF: Independent, Independent with basic ADLs, Independent with gait, and Independent with transfers  PATIENT GOALS get pain down, be able to get upstairs easier, build endurance back up    OBJECTIVE:   DIAGNOSTIC FINDINGS:    PATIENT SURVEYS:  FOTO will do next session  SCREENING FOR RED FLAGS: Bowel or bladder incontinence: No Spinal tumors: No Cauda equina syndrome: No Compression fracture: No Abdominal aneurysm: No  COGNITION:  Overall cognitive status: Within functional limits for tasks assessed     SENSATION: Not tested  MUSCLE LENGTH: Hamstrings WNL R, moderate limitation L  Piriformis WNL R, moderate limitation L  POSTURE: rounded shoulders, forward head, increased thoracic kyphosis, and flexed trunk   PALPATION: Lumbar and thoracic paraspinals tight but not TTP, no areas excessively tight or sore in B glutes or piriformis groups   LUMBAR ROM:   Active  A/PROM  eval  Flexion Mild limitation, RFIS no change in pain  Extension WNL, REIS improvement in pain   Right lateral flexion Moderate limitation   Left lateral flexion Moderate limitation   Right rotation   Left rotation    (Blank rows = not tested)  LOWER EXTREMITY ROM:     Active  Right eval Left eval  Hip flexion    Hip extension     Hip abduction    Hip adduction    Hip internal rotation    Hip external rotation    Knee flexion    Knee extension    Ankle dorsiflexion    Ankle plantarflexion    Ankle inversion    Ankle eversion     (Blank rows = not tested)  LOWER EXTREMITY MMT:    MMT Right eval Left eval  Hip flexion 4 4  Hip extension 3+ 3+  Hip abduction 4+ 4+  Hip adduction    Hip internal rotation    Hip external rotation    Knee flexion 4+ 4  Knee extension 4+ 4+  Ankle dorsiflexion 5 5  Ankle plantarflexion    Ankle inversion    Ankle eversion     (Blank rows = not tested)  LUMBAR SPECIAL TESTS:    FUNCTIONAL TESTS:    GAIT: Distance walked: in clinic distances  Assistive device utilized: None Level of assistance: Complete Independence Comments: antalgic, limited hip rotation, flexed at hips, holds trunk very rigid     TODAY'S TREATMENT  Pt seen for aquatic therapy today.  Treatment took place in water 3.25-3ft 8" in depth at the [facility] pool. Temp of water was 92.  Pt entered/exited the pool via stairs independently with bilat rail.  *  forward/ backward gait;   * holding yellow noodle and side stepping, modifying to shorter step length due to increased pain.  * straddling yellow noodle, holding yellow hand buoys:  gentle cycling, light jumping jacks, CC ski with legs suspended * holding wall: curtsy lunges with ab engaged on return to neutral;  hip openers * trunk ext  to neutral x 5 sec x 5 * Holding yellow noodle:  backwards gait., backwards marching  * plank with hands on bench, with hip ext, then with fire hydrants  * STS on bench with feet on blue step x 10 with cues for ab set, nose over toes and controlled descent * at stairs:  fig 4 stretch, hamstring  stretch  Pt requires the buoyancy and hydrostatic pressure of water for support, and to offload joints by unweighting joint load by at least 50 % in navel deep water and by at least 75-80% in chest to neck  deep water.  Viscosity of the water is needed for resistance of strengthening. Water current perturbations provides challenge to standing balance requiring increased core activation.  PATIENT EDUCATION:  Education details: aquatics progression  Person educated: Patient Education method: Psychiatrist comprehension: verbalized understanding and returned demonstration   HOME EXERCISE PROGRAM: [id number]   ASSESSMENT:  CLINICAL IMPRESSION: Pt with recent flare up of symptoms in L low back and Lt hip, higher than on last visit. She had increased pain with side stepping and transitioning between exercises. She reported improved symptoms with engagement of core.  Pain decreased to 4-5/10 towards end of aquatic session.  Encouraged self care (ie: ice/heat, core engaged during transitions) outside of therapy sessions.   Goals are ongoing.    OBJECTIVE IMPAIRMENTS Abnormal gait, difficulty walking, decreased ROM, decreased strength, hypomobility, increased fascial restrictions, increased muscle spasms, impaired flexibility, impaired sensation, improper body mechanics, postural dysfunction, obesity, and pain.   ACTIVITY LIMITATIONS carrying, lifting, bending, sitting, standing, squatting, stairs, transfers, locomotion level, and caring for others  PARTICIPATION LIMITATIONS: driving, shopping, community activity, occupation, and yard work  PERSONAL FACTORS Age, Fitness, Past/current experiences, Profession, and Time since onset of injury/illness/exacerbation are also affecting patient's functional outcome.   REHAB POTENTIAL: Good  CLINICAL DECISION MAKING: Stable/uncomplicated  EVALUATION COMPLEXITY: Low   GOALS: Goals reviewed with patient? Yes  SHORT TERM GOALS: Target date: 09/13/2021  Will be compliant with appropriate progressive HEP  Baseline: Goal status: Progessing  2.  Pain to be no more than 4/10 at worst and sciatic pain with have improved by 50% in  intensity  Baseline:  Goal status: Ongoing  3.  Will have better understanding of posture and biomechanics  Baseline:  Goal status: Ongoing   4.  Will be able to climb flight of step with U rail and step to pattern without increase in pain  Baseline:  Goal status: progressing    LONG TERM GOALS: Target date: 10/11/2021  MMT to be 5/5 globally  Baseline:  Goal status: INITIAL  2.  Pain to be no more than 2/10 at worst with functional task performance  Baseline:  Goal status: INITIAL  3.  Will be able to ascend/descent full flight of steps with no rails and reciprocal pattern, no increase in pain  Baseline:  Goal status: INITIAL  4.  Will demonstrate ability to perform mock/simulated patient transfers with good mechanics in order to assist in preventing injury at her job  Baseline:  Goal status: INITIAL     PLAN: PT FREQUENCY: 2x/week  PT DURATION: 8 weeks  PLANNED INTERVENTIONS: Therapeutic exercises, Therapeutic activity, Neuromuscular re-education, Balance training, Gait training, Patient/Family education, Self Care, Joint mobilization, Stair training, DME instructions, Aquatic Therapy, Dry Needling, Electrical stimulation, Spinal mobilization, Cryotherapy, Moist heat, Taping, Traction, Ultrasound, Ionotophoresis 4mg /ml Dexamethasone, Manual therapy, and Re-evaluation.  PLAN FOR NEXT SESSION: focus on extension based program, otherwise postural training and core strengthening, biomechanics training   , PTA 09/19/21 11:13 AM

## 2021-09-22 ENCOUNTER — Ambulatory Visit (HOSPITAL_BASED_OUTPATIENT_CLINIC_OR_DEPARTMENT_OTHER): Payer: 59 | Admitting: Physical Therapy

## 2021-09-27 ENCOUNTER — Ambulatory Visit (HOSPITAL_BASED_OUTPATIENT_CLINIC_OR_DEPARTMENT_OTHER): Payer: Commercial Managed Care - HMO | Admitting: Physical Therapy

## 2021-09-27 ENCOUNTER — Encounter (HOSPITAL_BASED_OUTPATIENT_CLINIC_OR_DEPARTMENT_OTHER): Payer: Self-pay | Admitting: Physical Therapy

## 2021-09-27 DIAGNOSIS — M5459 Other low back pain: Secondary | ICD-10-CM | POA: Diagnosis not present

## 2021-09-27 DIAGNOSIS — R29898 Other symptoms and signs involving the musculoskeletal system: Secondary | ICD-10-CM

## 2021-09-27 DIAGNOSIS — M6281 Muscle weakness (generalized): Secondary | ICD-10-CM

## 2021-09-27 NOTE — Therapy (Signed)
OUTPATIENT PHYSICAL THERAPY THORACOLUMBAR TREATMENT NOTE    Patient Name: Veronica Gonzales MRN: [id number] DOB:03-16-1970, 51 y.o., female Today's Date: 09/27/2021   PT End of Session - 09/27/21 1120     Visit Number 9    Number of Visits 17    Date for PT Re-Evaluation 10/11/21    Authorization Time Period 08/16/21 to 10/11/21    PT Start Time 1117    PT Stop Time 1158    PT Time Calculation (min) 41 min    Activity Tolerance Patient limited by pain    Behavior During Therapy [facility] for tasks assessed/performed               History reviewed. No pertinent past medical history. History reviewed. No pertinent surgical history. There are no problems to display for this patient.   PCP: does not currently have   REFERRING PROVIDER: Melina Schools, MD   REFERRING DIAG: M54.51 (ICD-10-CM) - Vertebrogenic low back pain   Rationale for Evaluation and Treatment Rehabilitation  THERAPY DIAG:  Other low back pain  Muscle weakness (generalized)  Other symptoms and signs involving the musculoskeletal system  ONSET DATE: 07/28/2021   SUBJECTIVE:                                                                                                                                                                                          SUBJECTIVE STATEMENT:  Pt reports that she has had 10/10 after working 12 hr shift.  She reports she gets relief with muscle relaxers.  "The stretches you did last time were helpful"   PERTINENT HISTORY:  MD Summary: Veronica Gonzales returns today for follow-up. Unfortunately 3 to 5 days after she left my office she contracted COVID and was unable to attend physical therapy. She states that she has done some self-directed exercises and is noted significant improvement.   At this point I have refilled her gabapentin as this is helping to relieve her pain and we will start formalized aquatic physical therapy. If she fails to improve or there is worsening of her  symptoms she knows to contact me I will be happy to see her back. Otherwise, she can follow-up with me on an as-needed basis.     PAIN:  Are you having pain? Yes: NPRS scale: 3/10, Pain location: Lt low back with radiation to left hip Pain description: combo of nerve pain, dull throbbing  Aggravating factors: sitting in recliner, transitions  Relieving factors: laying on the floor, rest    PRECAUTIONS: None  WEIGHT BEARING RESTRICTIONS No  FALLS:  Has patient fallen in last 6 months? No  LIVING ENVIRONMENT: Lives with:  lives alone Lives in: House/apartment Stairs: Yes: Internal: 16 steps; on right going up Has following equipment at home: None  OCCUPATION: nurse aide   PLOF: Independent, Independent with basic ADLs, Independent with gait, and Independent with transfers  PATIENT GOALS get pain down, be able to get upstairs easier, build endurance back up    OBJECTIVE:   DIAGNOSTIC FINDINGS:    PATIENT SURVEYS:  FOTO will do next session  SCREENING FOR RED FLAGS: Bowel or bladder incontinence: No Spinal tumors: No Cauda equina syndrome: No Compression fracture: No Abdominal aneurysm: No  COGNITION:  Overall cognitive status: Within functional limits for tasks assessed     SENSATION: Not tested  MUSCLE LENGTH: Hamstrings WNL R, moderate limitation L  Piriformis WNL R, moderate limitation L  POSTURE: rounded shoulders, forward head, increased thoracic kyphosis, and flexed trunk   PALPATION: Lumbar and thoracic paraspinals tight but not TTP, no areas excessively tight or sore in B glutes or piriformis groups   LUMBAR ROM:   Active  A/PROM  eval  Flexion Mild limitation, RFIS no change in pain  Extension WNL, REIS improvement in pain   Right lateral flexion Moderate limitation   Left lateral flexion Moderate limitation   Right rotation   Left rotation    (Blank rows = not tested)   LOWER EXTREMITY MMT:    MMT Right eval Left eval  Hip flexion 4  4  Hip extension 3+ 3+  Hip abduction 4+ 4+  Hip adduction    Hip internal rotation    Hip external rotation    Knee flexion 4+ 4  Knee extension 4+ 4+  Ankle dorsiflexion 5 5  Ankle plantarflexion    Ankle inversion    Ankle eversion     (Blank rows = not tested)  LUMBAR SPECIAL TESTS:    FUNCTIONAL TESTS:    GAIT: Distance walked: in clinic distances  Assistive device utilized: None Level of assistance: Complete Independence Comments: antalgic, limited hip rotation, flexed at hips, holds trunk very rigid     TODAY'S TREATMENT  Pt seen for aquatic therapy today.  Treatment took place in water 3.25-10ft 8" in depth at the [facility] pool. Temp of water was 92.  Pt entered/exited the pool via stairs independently with bilat rail.  * without support, submerged to chest:  forward/ backward gait;  side stepping  * holding wall: curtsy lunges with ab engaged on return to neutral;  hip openers * plank with hands on bench, with hip ext, then with fire hydrants   * straddling yellow noodle, holding yellow hand buoys:  gentle cycling, - switched to holding corner for light jumping jacks, CC ski with legs suspended - 2 rounds * walking forward lunge holding yellow hand buoys - 1 width; holding wall  for Rt split squats x 10 (LLE forward not tolerated) * at stairs:  fig 4 stretch, hamstring stretch, low back stretch with cues for form * Holding yellow noodle:  backwards gait., backwards marching  * returned to straddling noodle, holding corner cycling.   Pt requires the buoyancy and hydrostatic pressure of water for support, and to offload joints by unweighting joint load by at least 50 % in navel deep water and by at least 75-80% in chest to neck deep water.  Viscosity of the water is needed for resistance of strengthening. Water current perturbations provides challenge to standing balance requiring increased core activation.  PATIENT EDUCATION:  Education details: aquatics  progression  Person educated: Patient Education  method: Explanation and Demonstration Education comprehension: verbalized understanding and returned demonstration   HOME EXERCISE PROGRAM: [id number]   ASSESSMENT:  CLINICAL IMPRESSION: Pt did well with exercises until trial of walking lunges was introduced. She was able to complete without pain, but upon turning to start another width of pool her back spasmed.  Remainder of session was focused on reduction of spasm and relaxation.  She has reported reduction of low back symptoms with engagement of core.   Encouraged pt to begin searching for pool that would work for her outside of therapy sessions.   Pt progressing gradually towards goals.   OBJECTIVE IMPAIRMENTS Abnormal gait, difficulty walking, decreased ROM, decreased strength, hypomobility, increased fascial restrictions, increased muscle spasms, impaired flexibility, impaired sensation, improper body mechanics, postural dysfunction, obesity, and pain.   ACTIVITY LIMITATIONS carrying, lifting, bending, sitting, standing, squatting, stairs, transfers, locomotion level, and caring for others  PARTICIPATION LIMITATIONS: driving, shopping, community activity, occupation, and yard work  PERSONAL FACTORS Age, Fitness, Past/current experiences, Profession, and Time since onset of injury/illness/exacerbation are also affecting patient's functional outcome.   REHAB POTENTIAL: Good  CLINICAL DECISION MAKING: Stable/uncomplicated  EVALUATION COMPLEXITY: Low   GOALS: Goals reviewed with patient? Yes  SHORT TERM GOALS: Target date: 09/13/2021  Will be compliant with appropriate progressive HEP  Baseline: Goal status: Progessing  2.  Pain to be no more than 4/10 at worst and sciatic pain with have improved by 50% in intensity  Baseline:  Goal status: Ongoing  3.  Will have better understanding of posture and biomechanics  Baseline:  Goal status: Ongoing   4.  Will be able to climb  flight of step with U rail and step to pattern without increase in pain  Baseline:  Goal status: progressing    LONG TERM GOALS: Target date: 10/11/2021  MMT to be 5/5 globally  Baseline:  Goal status: INITIAL  2.  Pain to be no more than 2/10 at worst with functional task performance  Baseline:  Goal status: INITIAL  3.  Will be able to ascend/descent full flight of steps with no rails and reciprocal pattern, no increase in pain  Baseline:  Goal status: INITIAL  4.  Will demonstrate ability to perform mock/simulated patient transfers with good mechanics in order to assist in preventing injury at her job  Baseline:  Goal status: INITIAL     PLAN: PT FREQUENCY: 2x/week  PT DURATION: 8 weeks  PLANNED INTERVENTIONS: Therapeutic exercises, Therapeutic activity, Neuromuscular re-education, Balance training, Gait training, Patient/Family education, Self Care, Joint mobilization, Stair training, DME instructions, Aquatic Therapy, Dry Needling, Electrical stimulation, Spinal mobilization, Cryotherapy, Moist heat, Taping, Traction, Ultrasound, Ionotophoresis 4mg /ml Dexamethasone, Manual therapy, and Re-evaluation.  PLAN FOR NEXT SESSION: focus on extension based program, otherwise postural training and core strengthening, biomechanics training   Kerin Perna, PTA 09/27/21 1:53 PM [facility] [address] [place], [place], [address] Phone: [phone]   Fax:  [phone]

## 2021-09-30 ENCOUNTER — Ambulatory Visit (HOSPITAL_BASED_OUTPATIENT_CLINIC_OR_DEPARTMENT_OTHER): Payer: 59 | Admitting: Physical Therapy

## 2021-10-04 ENCOUNTER — Encounter (HOSPITAL_BASED_OUTPATIENT_CLINIC_OR_DEPARTMENT_OTHER): Payer: Self-pay | Admitting: Physical Therapy

## 2021-10-04 ENCOUNTER — Ambulatory Visit (HOSPITAL_BASED_OUTPATIENT_CLINIC_OR_DEPARTMENT_OTHER): Payer: Commercial Managed Care - HMO | Admitting: Physical Therapy

## 2021-10-04 DIAGNOSIS — R29898 Other symptoms and signs involving the musculoskeletal system: Secondary | ICD-10-CM

## 2021-10-04 DIAGNOSIS — M5459 Other low back pain: Secondary | ICD-10-CM | POA: Diagnosis not present

## 2021-10-04 DIAGNOSIS — M6281 Muscle weakness (generalized): Secondary | ICD-10-CM

## 2021-10-04 NOTE — Therapy (Signed)
OUTPATIENT PHYSICAL THERAPY THORACOLUMBAR TREATMENT NOTE    Patient Name: Veronica Gonzales MRN: [id number] DOB:[birth year], 51 y.o., female Today's Date: 10/04/2021   PT End of Session - 10/04/21 1018     Visit Number 10    Number of Visits 17    Date for PT Re-Evaluation 10/11/21    Authorization Time Period 08/16/21 to 10/11/21    PT Start Time 0955    PT Stop Time 1035    PT Time Calculation (min) 40 min    Behavior During Therapy [facility] for tasks assessed/performed               History reviewed. No pertinent past medical history. History reviewed. No pertinent surgical history. There are no problems to display for this patient.   PCP: does not currently have   REFERRING PROVIDER: Melina Schools, MD   REFERRING DIAG: M54.51 (ICD-10-CM) - Vertebrogenic low back pain   Rationale for Evaluation and Treatment Rehabilitation  THERAPY DIAG:  Other low back pain  Muscle weakness (generalized)  Other symptoms and signs involving the musculoskeletal system  ONSET DATE: 07/28/2021   SUBJECTIVE:                                                                                                                                                                                          SUBJECTIVE STATEMENT:  Pt reports her back pain is up and down.  She looked into facilities with pools so that she can continue working in pool when she finishes PT.    PERTINENT HISTORY:  MD Summary: Veronica Gonzales returns today for follow-up. Unfortunately 3 to 5 days after she left my office she contracted COVID and was unable to attend physical therapy. She states that she has done some self-directed exercises and is noted significant improvement.   At this point I have refilled her gabapentin as this is helping to relieve her pain and we will start formalized aquatic physical therapy. If she fails to improve or there is worsening of her symptoms she knows to contact me I will be happy to see her back.  Otherwise, she can follow-up with me on an as-needed basis.     PAIN:  Are you having pain? Yes: NPRS scale: 4/10, Pain location: lower back (bilat) Pain description: sharp/ achy, combo of nerve pain, dull throbbing  Aggravating factors: sitting in recliner, transitions  Relieving factors: laying on the floor, rest    PRECAUTIONS: None  WEIGHT BEARING RESTRICTIONS No  FALLS:  Has patient fallen in last 6 months? No  LIVING ENVIRONMENT: Lives with: lives alone Lives in: House/apartment Stairs: Yes: Internal: 16 steps; on right going  up Has following equipment at home: None  OCCUPATION: nurse aide   PLOF: Independent, Independent with basic ADLs, Independent with gait, and Independent with transfers  PATIENT GOALS get pain down, be able to get upstairs easier, build endurance back up    OBJECTIVE:   DIAGNOSTIC FINDINGS:    PATIENT SURVEYS:    SCREENING FOR RED FLAGS: Bowel or bladder incontinence: No Spinal tumors: No Cauda equina syndrome: No Compression fracture: No Abdominal aneurysm: No  COGNITION:  Overall cognitive status: Within functional limits for tasks assessed     SENSATION: Not tested  MUSCLE LENGTH: Hamstrings WNL R, moderate limitation L  Piriformis WNL R, moderate limitation L  POSTURE: rounded shoulders, forward head, increased thoracic kyphosis, and flexed trunk   PALPATION: Lumbar and thoracic paraspinals tight but not TTP, no areas excessively tight or sore in B glutes or piriformis groups   LUMBAR ROM:   Active  A/PROM  eval  Flexion Mild limitation, RFIS no change in pain  Extension WNL, REIS improvement in pain   Right lateral flexion Moderate limitation   Left lateral flexion Moderate limitation   Right rotation   Left rotation    (Blank rows = not tested)   LOWER EXTREMITY MMT:    MMT Right eval Left eval  Hip flexion 4 4  Hip extension 3+ 3+  Hip abduction 4+ 4+  Hip adduction    Hip internal rotation    Hip  external rotation    Knee flexion 4+ 4  Knee extension 4+ 4+  Ankle dorsiflexion 5 5  Ankle plantarflexion    Ankle inversion    Ankle eversion     (Blank rows = not tested)  LUMBAR SPECIAL TESTS:    FUNCTIONAL TESTS:    GAIT: Distance walked: in clinic distances  Assistive device utilized: None Level of assistance: Complete Independence Comments: antalgic, limited hip rotation, flexed at hips, holds trunk very rigid     TODAY'S TREATMENT  Pt seen for aquatic therapy today.  Treatment took place in water 3.25-12f 8" [facility] Temp of [facility] was 92.  Pt entered/exited the pool via stairs independently with bilat rail.  * without support, submerged to chest:  forward/ backward gait;  side stepping  * plank with hands on bench, with hip ext,   * straddling yellow noodle, holding corner:  gentle cycling,hip abdct/add, CC ski with legs suspended  * walking forward/ backward with kickboard 1/2 submerged in front of body for ab set- * holding wall: curtsy lunges with ab engaged on return to neutral;  SLS with IR/ER * STS at bench with blue step under feet  * at stairs:  fig 4 stretch, hamstring stretch, low back stretch with cues for form  Pt requires the buoyancy and hydrostatic pressure of water for support, and to offload joints by unweighting joint load by at least 50 % in navel deep water and by at least 75-80% in chest to neck deep water.  Viscosity of the water is needed for resistance of strengthening. Water current perturbations provides challenge to standing balance requiring increased core activation.  PATIENT EDUCATION:  Education details: aquatics progression  Person educated: Patient Education method: ECustomer service managerEducation comprehension: verbalized understanding and returned demonstration   HOME EXERCISE PROGRAM: [id number]   ASSESSMENT:  CLINICAL IMPRESSION:  Pt demonstrated difficulty with change in positions  (ie: plank to standing).  She reported reduction of pain and stiffness when completing session.  Plan to create  HEP for next session; continued encouragement for going to pool outside of therapy sessions.  Pt progressing gradually towards goals.  Plan to work on stairs with core engaged next session.  OBJECTIVE IMPAIRMENTS Abnormal gait, difficulty walking, decreased ROM, decreased strength, hypomobility, increased fascial restrictions, increased muscle spasms, impaired flexibility, impaired sensation, improper body mechanics, postural dysfunction, obesity, and pain.   ACTIVITY LIMITATIONS carrying, lifting, bending, sitting, standing, squatting, stairs, transfers, locomotion level, and caring for others  PARTICIPATION LIMITATIONS: driving, shopping, community activity, occupation, and yard work  PERSONAL FACTORS Age, Fitness, Past/current experiences, Profession, and Time since onset of injury/illness/exacerbation are also affecting patient's functional outcome.   REHAB POTENTIAL: Good  CLINICAL DECISION MAKING: Stable/uncomplicated  EVALUATION COMPLEXITY: Low   GOALS: Goals reviewed with patient? Yes  SHORT TERM GOALS: Target date: 09/13/2021  Will be compliant with appropriate progressive HEP  Baseline: Goal status: Progessing  2.  Pain to be no more than 4/10 at worst and sciatic pain with have improved by 50% in intensity  Baseline: Varies day to day Goal status: Not met -   3.  Will have better understanding of posture and biomechanics  Baseline:  Goal status: Progressing -9/26   4.  Will be able to climb flight of step with U rail and step to pattern without increase in pain  Baseline: can climb step, but painful - 9/26 Goal status: Partially met    LONG TERM GOALS: Target date: 10/11/2021  MMT to be 5/5 globally  Baseline:  Goal status: INITIAL  2.  Pain to be no more than 2/10 at worst with functional task performance  Baseline:  Goal status: INITIAL  3.  Will  be able to ascend/descent full flight of steps with no rails and reciprocal pattern, no increase in pain  Baseline:  Goal status: INITIAL  4.  Will demonstrate ability to perform mock/simulated patient transfers with good mechanics in order to assist in preventing injury at her job  Baseline:  Goal status: INITIAL     PLAN: PT FREQUENCY: 2x/week  PT DURATION: 8 weeks  PLANNED INTERVENTIONS: Therapeutic exercises, Therapeutic activity, Neuromuscular re-education, Balance training, Gait training, Patient/Family education, Self Care, Joint mobilization, Stair training, DME instructions, Aquatic Therapy, Dry Needling, Electrical stimulation, Spinal mobilization, Cryotherapy, Moist heat, Taping, Traction, Ultrasound, Ionotophoresis 61m/ml Dexamethasone, Manual therapy, and Re-evaluation.  PLAN FOR NEXT SESSION: focus on extension based program, otherwise postural training and core strengthening, biomechanics training    JKerin Perna PTA 10/04/21 3:10 PM [facility] [address] [place] [address]Phone: [phone]  Fax:  [phone]

## 2021-10-06 ENCOUNTER — Ambulatory Visit (HOSPITAL_BASED_OUTPATIENT_CLINIC_OR_DEPARTMENT_OTHER): Payer: 59 | Admitting: Physical Therapy

## 2021-10-07 ENCOUNTER — Ambulatory Visit (HOSPITAL_BASED_OUTPATIENT_CLINIC_OR_DEPARTMENT_OTHER): Payer: Commercial Managed Care - HMO | Admitting: Physical Therapy

## 2021-10-07 ENCOUNTER — Encounter (HOSPITAL_BASED_OUTPATIENT_CLINIC_OR_DEPARTMENT_OTHER): Payer: Self-pay | Admitting: Physical Therapy

## 2021-10-07 DIAGNOSIS — R29898 Other symptoms and signs involving the musculoskeletal system: Secondary | ICD-10-CM

## 2021-10-07 DIAGNOSIS — M5459 Other low back pain: Secondary | ICD-10-CM | POA: Diagnosis not present

## 2021-10-07 DIAGNOSIS — M6281 Muscle weakness (generalized): Secondary | ICD-10-CM

## 2021-10-07 NOTE — Therapy (Signed)
OUTPATIENT PHYSICAL THERAPY THORACOLUMBAR TREATMENT NOTE    Patient Name: Veronica Gonzales MRN: [id number] DOB:1970/03/01, 51 y.o., female Today's Date: 10/07/2021   PT End of Session - 10/07/21 0826     Visit Number 11    Number of Visits 17    Date for PT Re-Evaluation 10/11/21    Authorization Time Period 08/16/21 to 10/11/21    PT Start Time 0815    PT Stop Time 0855    PT Time Calculation (min) 40 min    Behavior During Therapy [facility] for tasks assessed/performed               History reviewed. No pertinent past medical history. History reviewed. No pertinent surgical history. There are no problems to display for this patient.   PCP: does not currently have   REFERRING PROVIDER: Melina Schools, MD   REFERRING DIAG: M54.51 (ICD-10-CM) - Vertebrogenic low back pain   Rationale for Evaluation and Treatment Rehabilitation  THERAPY DIAG:  Other low back pain  Muscle weakness (generalized)  Other symptoms and signs involving the musculoskeletal system  ONSET DATE: 07/28/2021   SUBJECTIVE:                                                                                                                                                                                          SUBJECTIVE STATEMENT:  Pt reports she is still taking gabapentin, tramadol and muscle relaxer to manage her pain.  She noticed at work this week, 9 hrs into 12 hr shift that her back wasn't as tired and tight.   She has been working on engaging stomach muscles with work tasks (nursing).   PERTINENT HISTORY:  MD Summary: Veronica Gonzales returns today for follow-up. Unfortunately 3 to 5 days after she left my office she contracted COVID and was unable to attend physical therapy. She states that she has done some self-directed exercises and is noted significant improvement.   At this point I have refilled her gabapentin as this is helping to relieve her pain and we will start formalized aquatic physical therapy.  If she fails to improve or there is worsening of her symptoms she knows to contact me I will be happy to see her back. Otherwise, she can follow-up with me on an as-needed basis.     PAIN:  Are you having pain? Yes: NPRS scale: 2/10, Pain location: lower back (bilat) into Lt posterior/lateral hip Pain description: sharp/ achy, combo of nerve pain, dull throbbing  Aggravating factors: work duties, transitions  Relieving factors: laying on the floor, rest    PRECAUTIONS: None  WEIGHT BEARING RESTRICTIONS No  FALLS:  Has patient  fallen in last 6 months? No  LIVING ENVIRONMENT: Lives with: lives alone Lives in: House/apartment Stairs: Yes: Internal: 16 steps; on right going up Has following equipment at home: None  OCCUPATION: nurse aide   PLOF: Independent, Independent with basic ADLs, Independent with gait, and Independent with transfers  PATIENT GOALS get pain down, be able to get upstairs easier, build endurance back up    OBJECTIVE:   DIAGNOSTIC FINDINGS:    PATIENT SURVEYS:    SCREENING FOR RED FLAGS: Bowel or bladder incontinence: No Spinal tumors: No Cauda equina syndrome: No Compression fracture: No Abdominal aneurysm: No  COGNITION:  Overall cognitive status: Within functional limits for tasks assessed     SENSATION: Not tested  MUSCLE LENGTH: Hamstrings WNL R, moderate limitation L  Piriformis WNL R, moderate limitation L  POSTURE: rounded shoulders, forward head, increased thoracic kyphosis, and flexed trunk   PALPATION: Lumbar and thoracic paraspinals tight but not TTP, no areas excessively tight or sore in B glutes or piriformis groups   LUMBAR ROM:   Active  A/PROM  eval  Flexion Mild limitation, RFIS no change in pain  Extension WNL, REIS improvement in pain   Right lateral flexion Moderate limitation   Left lateral flexion Moderate limitation   Right rotation   Left rotation    (Blank rows = not tested)   LOWER EXTREMITY MMT:     MMT Right eval Left eval  Hip flexion 4 4  Hip extension 3+ 3+  Hip abduction 4+ 4+  Hip adduction    Hip internal rotation    Hip external rotation    Knee flexion 4+ 4  Knee extension 4+ 4+  Ankle dorsiflexion 5 5  Ankle plantarflexion    Ankle inversion    Ankle eversion     (Blank rows = not tested)  LUMBAR SPECIAL TESTS:    FUNCTIONAL TESTS:    GAIT: Distance walked: in clinic distances  Assistive device utilized: None Level of assistance: Complete Independence Comments: antalgic, limited hip rotation, flexed at hips, holds trunk very rigid     TODAY'S TREATMENT  Pt seen for aquatic therapy today.  Treatment took place in water 3.25-11f 8" [facility] Temp of water was 92.  Pt entered/exited the pool via stairs independently with bilat rail.  * without support, submerged to chest:  forward/ backward gait;  side stepping; high knee marching (core engaged) * holding wall: curtsy lunges with ab engaged on return to neutral; then with added knee flex/hip abd; small hurdle leg * side step with squat, arm abdct/add in water - increased Lt hip pain * straddling yellow noodle, holding corner:  gentle cycling,hip abdct/add, CC ski with legs suspended  * plank with hands on bench, with hip ext, cues for neutral spine * STS on 3rd step from bottom, with cues for form x 6 reps * return to walking forward/backward for rest and recovery   Pt requires the buoyancy and hydrostatic pressure of water for support, and to offload joints by unweighting joint load by at least 50 % in navel deep water and by at least 75-80% in chest to neck deep water.  Viscosity of the water is needed for resistance of strengthening. Water current perturbations provides challenge to standing balance requiring increased core activation.  PATIENT EDUCATION:  Education details: aquatics progression  Person educated: Patient Education method: EHoliday representativeEducation comprehension: verbalized understanding and returned demonstration   HOME EXERCISE PROGRAM: [id number]  ASSESSMENT:  CLINICAL IMPRESSION: Pt reported increase in Lt hip pain with wide squats; pain reduced with LE suspended in deep water with cycling, hip abdct/add.   She was able to complete slow STS at 3rd step from bottom (75% submerged) with good form given cues and 0/10 pain. She requires cues for posture and form with good tolerance.  Overall she is noticing improved mobility but is not where she'd like to be.  Pt progressing gradually towards goals.  Plan to work on stairs with core engaged next session. Land assessment next visit.   OBJECTIVE IMPAIRMENTS Abnormal gait, difficulty walking, decreased ROM, decreased strength, hypomobility, increased fascial restrictions, increased muscle spasms, impaired flexibility, impaired sensation, improper body mechanics, postural dysfunction, obesity, and pain.   ACTIVITY LIMITATIONS carrying, lifting, bending, sitting, standing, squatting, stairs, transfers, locomotion level, and caring for others  PARTICIPATION LIMITATIONS: driving, shopping, community activity, occupation, and yard work  PERSONAL FACTORS Age, Fitness, Past/current experiences, Profession, and Time since onset of injury/illness/exacerbation are also affecting patient's functional outcome.   REHAB POTENTIAL: Good  CLINICAL DECISION MAKING: Stable/uncomplicated  EVALUATION COMPLEXITY: Low   GOALS: Goals reviewed with patient? Yes  SHORT TERM GOALS: Target date: 09/13/2021  Will be compliant with appropriate progressive HEP  Baseline: Goal status: Progessing  2.  Pain to be no more than 4/10 at worst and sciatic pain with have improved by 50% in intensity  Baseline: Varies day to day Goal status: Not met -   3.  Will have better understanding of posture and biomechanics  Baseline:  Goal status: Progressing -9/26   4.  Will be able to climb  flight of step with U rail and step to pattern without increase in pain  Baseline: can climb step, but painful - 9/26 Goal status: Partially met    LONG TERM GOALS: Target date: 10/11/2021  MMT to be 5/5 globally  Baseline:  Goal status: INITIAL  2.  Pain to be no more than 2/10 at worst with functional task performance  Baseline: up to 8/10 - 10/07/21 Goal status: Ongoing  3.  Will be able to ascend/descent full flight of steps with no rails and reciprocal pattern, no increase in pain  Baseline: requires rail; increase in pain with Lt forward step ups - 10/07/21 Goal status: Ongoing   4.  Will demonstrate ability to perform mock/simulated patient transfers with good mechanics in order to assist in preventing injury at her job  Baseline:  Goal status: INITIAL     PLAN: PT FREQUENCY: 2x/week  PT DURATION: 8 weeks  PLANNED INTERVENTIONS: Therapeutic exercises, Therapeutic activity, Neuromuscular re-education, Balance training, Gait training, Patient/Family education, Self Care, Joint mobilization, Stair training, DME instructions, Aquatic Therapy, Dry Needling, Electrical stimulation, Spinal mobilization, Cryotherapy, Moist heat, Taping, Traction, Ultrasound, Ionotophoresis 14m/ml Dexamethasone, Manual therapy, and Re-evaluation.  PLAN FOR NEXT SESSION: Land reassessment - focus on extension based program, review biomechanics training work     JKerin Perna PDelaware09/29/23 1:59 PM CH[place]ans[place]GHull NAl[place] [address]Phone: [phone]  Fax:  [phone]

## 2021-10-11 ENCOUNTER — Ambulatory Visit (HOSPITAL_BASED_OUTPATIENT_CLINIC_OR_DEPARTMENT_OTHER): Payer: Commercial Managed Care - HMO | Attending: Orthopedic Surgery | Admitting: Physical Therapy

## 2021-10-11 ENCOUNTER — Encounter (HOSPITAL_BASED_OUTPATIENT_CLINIC_OR_DEPARTMENT_OTHER): Payer: Self-pay | Admitting: Physical Therapy

## 2021-10-11 DIAGNOSIS — R208 Other disturbances of skin sensation: Secondary | ICD-10-CM | POA: Insufficient documentation

## 2021-10-11 DIAGNOSIS — M5459 Other low back pain: Secondary | ICD-10-CM | POA: Diagnosis present

## 2021-10-11 DIAGNOSIS — M6281 Muscle weakness (generalized): Secondary | ICD-10-CM | POA: Insufficient documentation

## 2021-10-11 DIAGNOSIS — R29898 Other symptoms and signs involving the musculoskeletal system: Secondary | ICD-10-CM | POA: Diagnosis present

## 2021-10-11 NOTE — Therapy (Signed)
OUTPATIENT PHYSICAL THERAPY THORACOLUMBAR TREATMENT NOTE    Patient Name: Veronica Gonzales MRN: [id number] DOB:[birth year], 51 y.o., female Today's Date: 10/11/2021   PT End of Session - 10/11/21 1148     Visit Number 12    Number of Visits 20    Date for PT Re-Evaluation 11/07/21    Authorization Time Period 08/16/21 to 10/11/21; extended to 11/07/21    PT Start Time 1101    PT Stop Time 1142    PT Time Calculation (min) 41 min    Activity Tolerance Patient limited by pain    Behavior During Therapy Veronica Gonzales for tasks assessed/performed                History reviewed. No pertinent past medical history. History reviewed. No pertinent surgical history. There are no problems to display for this patient.   PCP: does not currently have   REFERRING PROVIDER: Melina Schools, MD   REFERRING DIAG: M54.51 (ICD-10-CM) - Vertebrogenic low back pain   Rationale for Evaluation and Treatment Rehabilitation  THERAPY DIAG:  Other low back pain - Plan: PT plan of care cert/re-cert  Muscle weakness (generalized) - Plan: PT plan of care cert/re-cert  Other symptoms and signs involving the musculoskeletal system - Plan: PT plan of care cert/re-cert  Other disturbances of skin sensation - Plan: PT plan of care cert/re-cert  ONSET DATE: [id number]   SUBJECTIVE:                                                                                                                                                                                          SUBJECTIVE STATEMENT:  I'm still on a lot of medicines, I'm still in a lot of pain, I do feel PT has been helping. The stretching has been helping, when I leave therapy I'm good for a good 2 days. Went back to work within the past 4-6 weeks, its been difficult to adjust. Water and stretches that she gives me really help. HEP is going "OK", to be honest in the beginning I did very little of it, recently I've been able to get to some of my HEP every day.    PERTINENT HISTORY:  MD Summary: Veronica Gonzales returns today for follow-up. Unfortunately 3 to 5 days after she left my office she contracted COVID and was unable to attend physical therapy. She states that she has done some self-directed exercises and is noted significant improvement.   At this point I have refilled her gabapentin as this is helping to relieve her pain and we will start formalized aquatic physical therapy. If she fails to improve or there is worsening of her  symptoms she knows to contact me I will be happy to see her back. Otherwise, she can follow-up with me on an as-needed basis.     PAIN:  Are you having pain? Yes: NPRS scale: 4/10, Pain location: lower back (bilat) into Lt posterior/lateral hip Pain description: OA pain going across back, other pain is steady on L hip more of a stiff feeling and going down leg a little  Aggravating factors: work, getting up in the morning  Relieving factors: other than pool therapy, pain meds and mm relaxers   PRECAUTIONS: None  WEIGHT BEARING RESTRICTIONS No  FALLS:  Has patient fallen in last 6 months? No  LIVING ENVIRONMENT: Lives with: lives alone Lives in: House/apartment Stairs: Yes: Internal: 16 steps; on right going up Has following equipment at home: None  OCCUPATION: nurse aide   PLOF: Independent, Independent with basic ADLs, Independent with gait, and Independent with transfers  PATIENT GOALS get pain down, be able to get upstairs easier, build endurance back up    OBJECTIVE:   DIAGNOSTIC FINDINGS:    PATIENT SURVEYS:    SCREENING FOR RED FLAGS: Bowel or bladder incontinence: No Spinal tumors: No Cauda equina syndrome: No Compression fracture: No Abdominal aneurysm: No  COGNITION:  Overall cognitive status: Within functional limits for tasks assessed     SENSATION: Not tested  MUSCLE LENGTH: Hamstrings WNL R, moderate limitation L  Piriformis WNL R, moderate limitation L  POSTURE: rounded  shoulders, forward head, increased thoracic kyphosis, and flexed trunk   PALPATION: Lumbar and thoracic paraspinals tight but not TTP, no areas excessively tight or sore in B glutes or piriformis groups; 10/3- still very tight but not TTP in lumbar paraspinals   LUMBAR ROM:   Active  A/PROM  eval 10/11/21  Flexion Mild limitation, RFIS no change in pain Mild limitation, mild HS tightness   Extension WNL, REIS improvement in pain  WNL, perhaps even a bit hypermobile   Right lateral flexion Moderate limitation  Moderate limitation   Left lateral flexion Moderate limitation  Severe limitation   Right rotation    Left rotation     (Blank rows = not tested)   LOWER EXTREMITY MMT:    MMT Right eval Left eval 10/11/21 R 10/11/21 L   Hip flexion 4 4 3 3   Hip extension 3+ 3+ 3 3  Hip abduction 4+ 4+ 4+ 4  Hip adduction      Hip internal rotation      Hip external rotation      Knee flexion 4+ 4 4 4+  Knee extension 4+ 4+ 4+ 4+  Ankle dorsiflexion 5 5 5 5   Ankle plantarflexion      Ankle inversion      Ankle eversion       (Blank rows = not tested)  LUMBAR SPECIAL TESTS:    FUNCTIONAL TESTS:    GAIT: Distance walked: in clinic distances  Assistive device utilized: None Level of assistance: Complete Independence Comments: antalgic, limited hip rotation, flexed at hips, holds trunk very rigid     TODAY'S TREATMENT   PPT x10 with 3 second holds PPT with march x10  Lumbar rotation stretch with TrA set 5x10 seconds B Standing TrA sets x10 with 3 second holds    Lots of education on POC moving forward, need to see MD/pain center for medication management to help progress with PT, need for carryover from water therapy to land, and possible DC/referral back to MD if no further progress at  end of October   PATIENT EDUCATION:  Education details: aquatics progression  Person educated: Patient Education method: Dance movement psychotherapist comprehension: verbalized  understanding and returned demonstration   HOME EXERCISE PROGRAM: [id number]   ASSESSMENT:  CLINICAL IMPRESSION:  [name] arrives today doing OK, still having very high pain levels- I think she would really benefit from a visit with MD for some med management/possible adjustment here. Objective measures show mixed progress, however per discussion with primary treating PTA Rahini does seem to be making slow but steady progress with pool therapy. Really encouraged her to get appt with MD for med management, hopefully this will help [place] to progress her in PT.  At this time we will plan to extend therapy to the end of October, however if she does not show further objective improvement at that time we will need to DC/refer back to referring MD.   OBJECTIVE IMPAIRMENTS Abnormal gait, difficulty walking, decreased ROM, decreased strength, hypomobility, increased fascial restrictions, increased muscle spasms, impaired flexibility, impaired sensation, improper body mechanics, postural dysfunction, obesity, and pain.   ACTIVITY LIMITATIONS carrying, lifting, bending, sitting, standing, squatting, stairs, transfers, locomotion level, and caring for others  PARTICIPATION LIMITATIONS: driving, shopping, community activity, occupation, and yard work  PERSONAL FACTORS Age, Fitness, Past/current experiences, Profession, and Time since onset of injury/illness/exacerbation are also affecting patient's functional outcome.   REHAB POTENTIAL: Good  CLINICAL DECISION MAKING: Stable/uncomplicated  EVALUATION COMPLEXITY: Low   GOALS: Goals reviewed with patient? Yes  SHORT TERM GOALS: Target date: 09/13/2021  Will be compliant with appropriate progressive HEP  Baseline: Goal status: 10/3- ONGOING, doing some of HEP daily   2.  Pain to be no more than 4/10 at worst and sciatic pain with have improved by 50% in intensity  Baseline: Goal status: 10/3- ONGOING still gets to 9-10/10 at worst, sciatic pain has  good and bad days. Pain depends on the day   3.  Will have better understanding of posture and biomechanics  Baseline:  Goal status: 10/3- ONGOING, progressing   4.  Will be able to climb flight of step with U rail and step to pattern without increase in pain  Baseline:  Goal status: 10/3- ONGOING, stairs are still difficult, no significant change     LONG TERM GOALS: Target date: 10/11/2021  MMT to be 5/5 globally  Baseline:  Goal status: IN PROGRESS 10/3 no significant change   2.  Pain to be no more than 2/10 at worst with functional task performance  Baseline:  Goal status: 10/3- ONGOING can still get to 2/10  3.  Will be able to ascend/descent full flight of steps with no rails and reciprocal pattern, no increase in pain  Baseline:  Goal status: 10/3 ONGOING, still difficult   4.  Will demonstrate ability to perform mock/simulated patient transfers with good mechanics in order to assist in preventing injury at her job  Baseline:  Goal status: IN PROGRESS 10/3- still getting a spasm/sharp pains when transferring, usually gets some help from other staff for transfers for safety      PLAN: PT FREQUENCY: 2x/week  PT DURATION: 4 weeks  PLANNED INTERVENTIONS: Therapeutic exercises, Therapeutic activity, Neuromuscular re-education, Balance training, Gait training, Patient/Family education, Self Care, Joint mobilization, Stair training, DME instructions, Aquatic Therapy, Dry Needling, Electrical stimulation, Spinal mobilization, Cryotherapy, Moist heat, Taping, Traction, Ultrasound, Ionotophoresis 4mg /ml Dexamethasone, Manual therapy, and Re-evaluation.  PLAN FOR NEXT SESSION: Land reassessment - focus on extension based program, review biomechanics training work. Possible DC  end of October if she has not made objective improvement/pain is not improved      Ann Lions PT DPT PN2  10/11/2021, 11:51 AM  [facility] [place], [place], [address] Phone: [phone]   Fax:  [phone]

## 2021-10-13 ENCOUNTER — Encounter (HOSPITAL_BASED_OUTPATIENT_CLINIC_OR_DEPARTMENT_OTHER): Payer: Self-pay | Admitting: Physical Therapy

## 2021-10-13 ENCOUNTER — Ambulatory Visit (HOSPITAL_BASED_OUTPATIENT_CLINIC_OR_DEPARTMENT_OTHER): Payer: Commercial Managed Care - HMO | Admitting: Physical Therapy

## 2021-10-13 DIAGNOSIS — M5459 Other low back pain: Secondary | ICD-10-CM

## 2021-10-13 DIAGNOSIS — R29898 Other symptoms and signs involving the musculoskeletal system: Secondary | ICD-10-CM

## 2021-10-13 DIAGNOSIS — M6281 Muscle weakness (generalized): Secondary | ICD-10-CM

## 2021-10-13 NOTE — Therapy (Signed)
OUTPATIENT PHYSICAL THERAPY THORACOLUMBAR TREATMENT NOTE    Patient Name: Veronica Gonzales MRN: [id number] DOB:02-Sep-1970, 51 y.o., female Today's Date: 10/13/2021   PT End of Session - 10/13/21 0906     Visit Number 13    Number of Visits 20    Date for PT Re-Evaluation 11/07/21    Authorization Time Period 08/16/21 to 10/11/21; extended to 11/07/21    PT Start Time 0906    PT Stop Time 0945    PT Time Calculation (min) 39 min                History reviewed. No pertinent past medical history. History reviewed. No pertinent surgical history. There are no problems to display for this patient.   PCP: does not currently have   REFERRING PROVIDER: Venita Lick, MD   REFERRING DIAG: M54.51 (ICD-10-CM) - Vertebrogenic low back pain   Rationale for Evaluation and Treatment Rehabilitation  THERAPY DIAG:  Other low back pain  Muscle weakness (generalized)  Other symptoms and signs involving the musculoskeletal system  ONSET DATE: 07/28/2021   SUBJECTIVE:                                                                                                                                                                                          SUBJECTIVE STATEMENT:  Pt reports she has been off of work the last 3 days, "today is a good day".   Pt plans to go to [facility], but won't be able to check it out for another 2 wks due to car issues.    PERTINENT HISTORY:  MD Summary: Michayla returns today for follow-up. Unfortunately 3 to 5 days after she left my office she contracted COVID and was unable to attend physical therapy. She states that she has done some self-directed exercises and is noted significant improvement.   At this point I have refilled her gabapentin as this is helping to relieve her pain and we will start formalized aquatic physical therapy. If she fails to improve or there is worsening of her symptoms she knows to contact me I will be happy to see her back. Otherwise,  she can follow-up with me on an as-needed basis.     PAIN:  Are you having pain? Yes: NPRS scale: 3/10, Pain location: lower back (bilat) into Lt posterior/lateral hip Pain description: OA pain going across back, other pain is steady on L hip more of a stiff feeling and going down leg a little  Aggravating factors: work, getting up in the morning  Relieving factors: other than pool therapy, pain meds and mm relaxers   PRECAUTIONS: None  WEIGHT BEARING  RESTRICTIONS No  FALLS:  Has patient fallen in last 6 months? No  LIVING ENVIRONMENT: Lives with: lives alone Lives in: House/apartment Stairs: Yes: Internal: 16 steps; on right going up Has following equipment at home: None  OCCUPATION: nurse aide   PLOF: Independent, Independent with basic ADLs, Independent with gait, and Independent with transfers  PATIENT GOALS get pain down, be able to get upstairs easier, build endurance back up    OBJECTIVE:   DIAGNOSTIC FINDINGS:    PATIENT SURVEYS:    SCREENING FOR RED FLAGS: Bowel or bladder incontinence: No Spinal tumors: No Cauda equina syndrome: No Compression fracture: No Abdominal aneurysm: No  COGNITION:  Overall cognitive status: Within functional limits for tasks assessed     SENSATION: Not tested  MUSCLE LENGTH: Hamstrings WNL R, moderate limitation L  Piriformis WNL R, moderate limitation L  POSTURE: rounded shoulders, forward head, increased thoracic kyphosis, and flexed trunk   PALPATION: Lumbar and thoracic paraspinals tight but not TTP, no areas excessively tight or sore in B glutes or piriformis groups; 10/3- still very tight but not TTP in lumbar paraspinals   LUMBAR ROM:   Active  A/PROM  eval 10/11/21  Flexion Mild limitation, RFIS no change in pain Mild limitation, mild HS tightness   Extension WNL, REIS improvement in pain  WNL, perhaps even a bit hypermobile   Right lateral flexion Moderate limitation  Moderate limitation   Left lateral  flexion Moderate limitation  Severe limitation   Right rotation    Left rotation     (Blank rows = not tested)   LOWER EXTREMITY MMT:    MMT Right eval Left eval 10/11/21 R 10/11/21 L   Hip flexion 4 4 3 3   Hip extension 3+ 3+ 3 3  Hip abduction 4+ 4+ 4+ 4  Hip adduction      Hip internal rotation      Hip external rotation      Knee flexion 4+ 4 4 4+  Knee extension 4+ 4+ 4+ 4+  Ankle dorsiflexion 5 5 5 5   Ankle plantarflexion      Ankle inversion      Ankle eversion       (Blank rows = not tested)  LUMBAR SPECIAL TESTS:    FUNCTIONAL TESTS:    GAIT: Distance walked: in clinic distances  Assistive device utilized: None Level of assistance: Complete Independence Comments: antalgic, limited hip rotation, flexed at hips, holds trunk very rigid     TODAY'S TREATMENT  Pt seen for aquatic therapy today.  Treatment took place in water 3.25-6ft 8" in depth at the pool. Temp of water was 92.  Pt entered/exited the pool via stairs independently with bilat rail.   * without support, submerged to chest:  forward/ backward gait;  side stepping with rainbow hand  buoys and shoulder add * forward step ups, alternating LE with bilat rail - cues for engaging core and glutes - then reciprocal x 4 steps up/retro back * straddling yellow noodle, holding corner:  gentle cycling,hip abdct/add, CC ski with legs suspended * plank with hands on bench, with hip ext, cues for neutral spine * STS on 3rd step from bottom, with cues for form x 5 reps  ( improved form)  * return to walking forward/backward for rest and recovery * holding wall: curtsy lunges with ab engaged on return to neutral with added knee flex/hip abd  Pt requires the buoyancy and hydrostatic pressure of water for support, and to offload joints by unweighting joint load by at least 50 % in navel deep water and by at least 75-80% in chest to neck deep water.  Viscosity of the water is needed  for resistance of strengthening. Water current perturbations provides challenge to standing balance requiring increased core activation     PATIENT EDUCATION:  Education details: aquatics progression  Person educated: Patient Education method: Medical illustrator Education comprehension: verbalized understanding and returned demonstration   HOME EXERCISE PROGRAM: [id number]   ASSESSMENT:  CLINICAL IMPRESSION: Pt initially fearful to complete forward step ups due to perceived increase in knee pain.  With cues on form, pt able to complete forward step ups (4) in reciprocal pattern without pain today.  Pt reported elimination of pain while in water.  Continued pain education during session.  Improved sit to/from stand form.  Per PT on 10/11/21, plan to extend therapy to the end of October, however if she does not show further objective improvement at that time we will need to DC/refer back to referring MD.   OBJECTIVE IMPAIRMENTS Abnormal gait, difficulty walking, decreased ROM, decreased strength, hypomobility, increased fascial restrictions, increased muscle spasms, impaired flexibility, impaired sensation, improper body mechanics, postural dysfunction, obesity, and pain.   ACTIVITY LIMITATIONS carrying, lifting, bending, sitting, standing, squatting, stairs, transfers, locomotion level, and caring for others  PARTICIPATION LIMITATIONS: driving, shopping, community activity, occupation, and yard work  PERSONAL FACTORS Age, Fitness, Past/current experiences, Profession, and Time since onset of injury/illness/exacerbation are also affecting patient's functional outcome.   REHAB POTENTIAL: Good  CLINICAL DECISION MAKING: Stable/uncomplicated  EVALUATION COMPLEXITY: Low   GOALS: Goals reviewed with patient? Yes  SHORT TERM GOALS: Target date: 09/13/2021  Will be compliant with appropriate progressive HEP  Baseline: Goal status: 10/3- ONGOING, doing some of HEP daily   2.   Pain to be no more than 4/10 at worst and sciatic pain with have improved by 50% in intensity  Baseline: Goal status: 10/3- ONGOING still gets to 9-10/10 at worst, sciatic pain has good and bad days. Pain depends on the day   3.  Will have better understanding of posture and biomechanics  Baseline:  Goal status: 10/3- ONGOING, progressing   4.  Will be able to climb flight of step with U rail and step to pattern without increase in pain  Baseline:  Goal status: 10/3- ONGOING, stairs are still difficult, no significant change     LONG TERM GOALS: Target date: 10/11/2021  MMT to be 5/5 globally  Baseline:  Goal status: IN PROGRESS 10/3 no significant change   2.  Pain to be no more than 2/10 at worst with functional task performance  Baseline:  Goal status: 10/3- ONGOING can still get to 2/10  3.  Will be able to ascend/descent full flight of steps with no rails and reciprocal pattern, no increase in pain  Baseline:  Goal status: 10/3 ONGOING, still difficult   4.  Will demonstrate ability to perform mock/simulated patient transfers with good mechanics in order to assist in preventing injury at her job  Baseline:  Goal status: IN PROGRESS 10/3- still getting a spasm/sharp pains when transferring, usually gets some help from other staff for transfers for safety      PLAN: PT FREQUENCY: 2x/week  PT DURATION: 4 weeks  PLANNED INTERVENTIONS: Therapeutic exercises, Therapeutic activity, Neuromuscular re-education, Balance training, Gait training, Patient/Family education, Self Care, Joint mobilization, Stair training, DME instructions,  Aquatic Therapy, Dry Needling, Electrical stimulation, Spinal mobilization, Cryotherapy, Moist heat, Taping, Traction, Ultrasound, Ionotophoresis 4mg /ml Dexamethasone, Manual therapy, and Re-evaluation.  PLAN FOR NEXT SESSION: Land reassessment - focus on extension based program, review biomechanics training work.   Kerin Perna,  PTA 10/13/21 9:44 AM [facility] [address] [place], [place], [address] Phone: [phone]   Fax:  [phone]

## 2021-10-18 ENCOUNTER — Encounter (HOSPITAL_BASED_OUTPATIENT_CLINIC_OR_DEPARTMENT_OTHER): Payer: Self-pay | Admitting: Physical Therapy

## 2021-10-18 ENCOUNTER — Ambulatory Visit (HOSPITAL_BASED_OUTPATIENT_CLINIC_OR_DEPARTMENT_OTHER): Payer: Commercial Managed Care - HMO | Admitting: Physical Therapy

## 2021-10-18 DIAGNOSIS — M5459 Other low back pain: Secondary | ICD-10-CM

## 2021-10-18 DIAGNOSIS — R208 Other disturbances of skin sensation: Secondary | ICD-10-CM

## 2021-10-18 DIAGNOSIS — R29898 Other symptoms and signs involving the musculoskeletal system: Secondary | ICD-10-CM

## 2021-10-18 DIAGNOSIS — M6281 Muscle weakness (generalized): Secondary | ICD-10-CM

## 2021-10-18 NOTE — Therapy (Signed)
OUTPATIENT PHYSICAL THERAPY THORACOLUMBAR TREATMENT NOTE    Patient Name: Veronica Gonzales MRN: [id number] DOB:[birth year], 51 y.o., female Today's Date: 10/18/2021   PT End of Session - 10/18/21 1004     Visit Number 14    Number of Visits 20    Date for PT Re-Evaluation 11/07/21    Authorization Time Period 08/16/21 to 10/11/21; extended to 11/07/21    PT Start Time 0959   pt late for appointment   PT Stop Time 1040    PT Time Calculation (min) 41 min    Activity Tolerance Patient tolerated treatment well    Behavior During Therapy [facility] for tasks assessed/performed                History reviewed. No pertinent past medical history. History reviewed. No pertinent surgical history. There are no problems to display for this patient.   PCP: does not currently have   REFERRING PROVIDER: Venita Lick, MD   REFERRING DIAG: M54.51 (ICD-10-CM) - Vertebrogenic low back pain   Rationale for Evaluation and Treatment Rehabilitation  THERAPY DIAG:  Other low back pain  Muscle weakness (generalized)  Other symptoms and signs involving the musculoskeletal system  Other disturbances of skin sensation  ONSET DATE: 07/28/2021   SUBJECTIVE:                                                                                                                                                                                          SUBJECTIVE STATEMENT:  Pt reports the loss of a pet this am. She states pain is about where it was last session.  She also states reduction of symptoms for a couple of days after aquatic sessions    PERTINENT HISTORY:  MD Summary: Veronica Gonzales returns today for follow-up. Unfortunately 3 to 5 days after she left my office she contracted COVID and was unable to attend physical therapy. She states that she has done some self-directed exercises and is noted significant improvement.   At this point I have refilled her gabapentin as this is helping to relieve her pain  and we will start formalized aquatic physical therapy. If she fails to improve or there is worsening of her symptoms she knows to contact me I will be happy to see her back. Otherwise, she can follow-up with me on an as-needed basis.     PAIN:  Are you having pain? Yes: NPRS scale: 3/10, Pain location: lower back (bilat) into Lt posterior/lateral hip Pain description: OA pain going across back, other pain is steady on L hip more of a stiff feeling and going down leg a little  Aggravating factors: work, getting  up in the morning  Relieving factors: other than pool therapy, pain meds and mm relaxers   PRECAUTIONS: None  WEIGHT BEARING RESTRICTIONS No  FALLS:  Has patient fallen in last 6 months? No  LIVING ENVIRONMENT: Lives with: lives alone Lives in: House/apartment Stairs: Yes: Internal: 16 steps; on right going up Has following equipment at home: None  OCCUPATION: nurse aide   PLOF: Independent, Independent with basic ADLs, Independent with gait, and Independent with transfers  PATIENT GOALS get pain down, be able to get upstairs easier, build endurance back up    OBJECTIVE:   DIAGNOSTIC FINDINGS:    PATIENT SURVEYS:    SCREENING FOR RED FLAGS: Bowel or bladder incontinence: No Spinal tumors: No Cauda equina syndrome: No Compression fracture: No Abdominal aneurysm: No  COGNITION:  Overall cognitive status: Within functional limits for tasks assessed     SENSATION: Not tested  MUSCLE LENGTH: Hamstrings WNL R, moderate limitation L  Piriformis WNL R, moderate limitation L  POSTURE: rounded shoulders, forward head, increased thoracic kyphosis, and flexed trunk   PALPATION: Lumbar and thoracic paraspinals tight but not TTP, no areas excessively tight or sore in B glutes or piriformis groups; 10/3- still very tight but not TTP in lumbar paraspinals   LUMBAR ROM:   Active  A/PROM  eval 10/11/21  Flexion Mild limitation, RFIS no change in pain Mild  limitation, mild HS tightness   Extension WNL, REIS improvement in pain  WNL, perhaps even a bit hypermobile   Right lateral flexion Moderate limitation  Moderate limitation   Left lateral flexion Moderate limitation  Severe limitation   Right rotation    Left rotation     (Blank rows = not tested)   LOWER EXTREMITY MMT:    MMT Right eval Left eval 10/11/21 R 10/11/21 L   Hip flexion 4 4 3 3   Hip extension 3+ 3+ 3 3  Hip abduction 4+ 4+ 4+ 4  Hip adduction      Hip internal rotation      Hip external rotation      Knee flexion 4+ 4 4 4+  Knee extension 4+ 4+ 4+ 4+  Ankle dorsiflexion 5 5 5 5   Ankle plantarflexion      Ankle inversion      Ankle eversion       (Blank rows = not tested)  LUMBAR SPECIAL TESTS:    FUNCTIONAL TESTS:    GAIT: Distance walked: in clinic distances  Assistive device utilized: None Level of assistance: Complete Independence Comments: antalgic, limited hip rotation, flexed at hips, holds trunk very rigid     TODAY'S TREATMENT  Pt seen for aquatic therapy today.  Treatment took place in water 3.25-46ft 8" in depth at the pool. Temp of water was 92.  Pt entered/exited the pool via stairs independently with bilat rail.   * without support, submerged to chest:  forward/ backward gait;  side stepping with rainbow hand  buoys and shoulder add *L stretch alternated with lumbar rotation Standing ue supported on wall: hip openers x 10 *plank with hands on bench, with hip ext, cues for neutral spine * forward step ups R/L x 10 ue supported on handrail *braiding step posteriorly only x 2 widths *STS on 3rd step from bottom, with cues for form x 5 reps *Adductor set with BB 7 x 10s hold *STS with adductor set 3rd step  x 6. Cues for weight ift and gaining immediate standing balance, * straddling yellow noodle,  holding corner:  gentle cycling,hip abdct/add, CC ski with legs suspended * return to walking forward/backward for rest and  recovery                 Pt requires the buoyancy and hydrostatic pressure of water for support, and to offload joints by unweighting joint load by at least 50 % in navel deep water and by at least 75-80% in chest to neck deep water.  Viscosity of the water is needed for resistance of strengthening. Water current perturbations provides challenge to standing balance requiring increased core activation     PATIENT EDUCATION:  Education details: aquatics progression  Person educated: Patient Education method: Medical illustrator Education comprehension: verbalized understanding and returned demonstration   HOME EXERCISE PROGRAM: [id number]   ASSESSMENT:  CLINICAL IMPRESSION: Less pain on onset of treatment today moving with increased ease. She reports decrease in LBP with progression of session but states LB feels stiff and fatigued. No knee pain or apprehension with step ups completing/tolerating well. Requires vc and demonstration for braiding which pt has difficulty with due to weakness lle. Goals ongoing   Per PT on 10/11/21, plan to extend therapy to the end of October, however if she does not show further objective improvement at that time we will need to DC/refer back to referring MD.   OBJECTIVE IMPAIRMENTS Abnormal gait, difficulty walking, decreased ROM, decreased strength, hypomobility, increased fascial restrictions, increased muscle spasms, impaired flexibility, impaired sensation, improper body mechanics, postural dysfunction, obesity, and pain.   ACTIVITY LIMITATIONS carrying, lifting, bending, sitting, standing, squatting, stairs, transfers, locomotion level, and caring for others  PARTICIPATION LIMITATIONS: driving, shopping, community activity, occupation, and yard work  PERSONAL FACTORS Age, Fitness, Past/current experiences, Profession, and Time since onset of injury/illness/exacerbation are also affecting patient's functional outcome.   REHAB POTENTIAL:  Good  CLINICAL DECISION MAKING: Stable/uncomplicated  EVALUATION COMPLEXITY: Low   GOALS: Goals reviewed with patient? Yes  SHORT TERM GOALS: Target date: 09/13/2021  Will be compliant with appropriate progressive HEP  Baseline: Goal status: 10/3- ONGOING, doing some of HEP daily   2.  Pain to be no more than 4/10 at worst and sciatic pain with have improved by 50% in intensity  Baseline: Goal status: 10/3- ONGOING still gets to 9-10/10 at worst, sciatic pain has good and bad days. Pain depends on the day   3.  Will have better understanding of posture and biomechanics  Baseline:  Goal status: 10/3- ONGOING, progressing   4.  Will be able to climb flight of step with U rail and step to pattern without increase in pain  Baseline:  Goal status: 10/3- ONGOING, stairs are still difficult, no significant change     LONG TERM GOALS: Target date: 10/11/2021  MMT to be 5/5 globally  Baseline:  Goal status: IN PROGRESS 10/3 no significant change   2.  Pain to be no more than 2/10 at worst with functional task performance  Baseline:  Goal status: 10/3- ONGOING can still get to 2/10  3.  Will be able to ascend/descent full flight of steps with no rails and reciprocal pattern, no increase in pain  Baseline:  Goal status: 10/3 ONGOING, still difficult   4.  Will demonstrate ability to perform mock/simulated patient transfers with good mechanics in order to assist in preventing injury at her job  Baseline:  Goal status: IN PROGRESS 10/3- still getting a spasm/sharp pains when transferring, usually gets some help from other staff for transfers for safety  PLAN: PT FREQUENCY: 2x/week  PT DURATION: 4 weeks  PLANNED INTERVENTIONS: Therapeutic exercises, Therapeutic activity, Neuromuscular re-education, Balance training, Gait training, Patient/Family education, Self Care, Joint mobilization, Stair training, DME instructions, Aquatic Therapy, Dry Needling, Electrical stimulation,  Spinal mobilization, Cryotherapy, Moist heat, Taping, Traction, Ultrasound, Ionotophoresis 4mg /ml Dexamethasone, Manual therapy, and Re-evaluation.  PLAN FOR NEXT SESSION: Land reassessment - focus on extension based program, review biomechanics training work.   Stanton Kidney [place]) Judas Mohammad MPT 10/18/21 10:44 AM [facility] [address] [place], [place], [address] Phone: [phone]   Fax:  [phone]

## 2021-10-21 ENCOUNTER — Ambulatory Visit (HOSPITAL_BASED_OUTPATIENT_CLINIC_OR_DEPARTMENT_OTHER): Payer: 59 | Admitting: Physical Therapy

## 2021-10-25 ENCOUNTER — Ambulatory Visit (HOSPITAL_BASED_OUTPATIENT_CLINIC_OR_DEPARTMENT_OTHER): Payer: Commercial Managed Care - HMO | Admitting: Physical Therapy

## 2021-10-25 ENCOUNTER — Encounter (HOSPITAL_BASED_OUTPATIENT_CLINIC_OR_DEPARTMENT_OTHER): Payer: Self-pay | Admitting: Physical Therapy

## 2021-10-25 DIAGNOSIS — M6281 Muscle weakness (generalized): Secondary | ICD-10-CM

## 2021-10-25 DIAGNOSIS — M5459 Other low back pain: Secondary | ICD-10-CM | POA: Diagnosis not present

## 2021-10-25 DIAGNOSIS — R29898 Other symptoms and signs involving the musculoskeletal system: Secondary | ICD-10-CM

## 2021-10-25 NOTE — Therapy (Signed)
OUTPATIENT PHYSICAL THERAPY THORACOLUMBAR TREATMENT NOTE    Patient Name: Veronica Gonzales MRN: [id number] DOB:1970/10/04, 51 y.o., female Today's Date: 10/25/2021   PT End of Session - 10/25/21 1001     Visit Number 15    Number of Visits 20    Date for PT Re-Evaluation 11/07/21    Authorization Time Period 08/16/21 to 10/11/21; extended to 11/07/21    PT Start Time 0953   pt arrived late to pool area   PT Stop Time 1031    PT Time Calculation (min) 38 min    Activity Tolerance Patient tolerated treatment well    Behavior During Therapy [facility] for tasks assessed/performed                History reviewed. No pertinent past medical history. History reviewed. No pertinent surgical history. There are no problems to display for this patient.   PCP: does not currently have   REFERRING PROVIDER: Venita Lick, MD   REFERRING DIAG: M54.51 (ICD-10-CM) - Vertebrogenic low back pain   Rationale for Evaluation and Treatment Rehabilitation  THERAPY DIAG:  Other low back pain  Muscle weakness (generalized)  Other symptoms and signs involving the musculoskeletal system  ONSET DATE: 07/28/2021   SUBJECTIVE:                                                                                                                                                                                          SUBJECTIVE STATEMENT:  Pt reports she is concerned about continuing at current job, too hard on her body - 3 long shifts back to back.  She states her vehicle should be fixed tomorrow and then she would like to start driving to the gym.  PERTINENT HISTORY:  MD Summary: [name] returns today for follow-up. Unfortunately 3 to 5 days after she left my office she contracted COVID and was unable to attend physical therapy. She states that she has done some self-directed exercises and is noted significant improvement.   At this point I have refilled her gabapentin as this is helping to relieve her  pain and we will start formalized aquatic physical therapy. If she fails to improve or there is worsening of her symptoms she knows to contact me I will be happy to see her back. Otherwise, she can follow-up with me on an as-needed basis.     PAIN:  Are you having pain? Yes: NPRS scale: 3/10, Pain location: lower back (bilat) into Lt posterior/lateral hip Pain description: OA pain going across back, other pain is steady on L hip more of a stiff feeling and going down leg a little  Aggravating factors:  work, getting up in the morning  Relieving factors: other than pool therapy, pain meds and mm relaxers   PRECAUTIONS: None  WEIGHT BEARING RESTRICTIONS No  FALLS:  Has patient fallen in last 6 months? No  LIVING ENVIRONMENT: Lives with: lives alone Lives in: House/apartment Stairs: Yes: Internal: 16 steps; on right going up Has following equipment at home: None  OCCUPATION: nurse aide   PLOF: Independent, Independent with basic ADLs, Independent with gait, and Independent with transfers  PATIENT GOALS get pain down, be able to get upstairs easier, build endurance back up    OBJECTIVE:   DIAGNOSTIC FINDINGS:    PATIENT SURVEYS:    SCREENING FOR RED FLAGS: Bowel or bladder incontinence: No Spinal tumors: No Cauda equina syndrome: No Compression fracture: No Abdominal aneurysm: No  COGNITION:  Overall cognitive status: Within functional limits for tasks assessed     SENSATION: Not tested  MUSCLE LENGTH: Hamstrings WNL R, moderate limitation L  Piriformis WNL R, moderate limitation L  POSTURE: rounded shoulders, forward head, increased thoracic kyphosis, and flexed trunk   PALPATION: Lumbar and thoracic paraspinals tight but not TTP, no areas excessively tight or sore in B glutes or piriformis groups; 10/3- still very tight but not TTP in lumbar paraspinals   LUMBAR ROM:   Active  A/PROM  eval 10/11/21  Flexion Mild limitation, RFIS no change in pain Mild  limitation, mild HS tightness   Extension WNL, REIS improvement in pain  WNL, perhaps even a bit hypermobile   Right lateral flexion Moderate limitation  Moderate limitation   Left lateral flexion Moderate limitation  Severe limitation   Right rotation    Left rotation     (Blank rows = not tested)   LOWER EXTREMITY MMT:    MMT Right eval Left eval 10/11/21 R 10/11/21 L   Hip flexion 4 4 3 3   Hip extension 3+ 3+ 3 3  Hip abduction 4+ 4+ 4+ 4  Hip adduction      Hip internal rotation      Hip external rotation      Knee flexion 4+ 4 4 4+  Knee extension 4+ 4+ 4+ 4+  Ankle dorsiflexion 5 5 5 5   Ankle plantarflexion      Ankle inversion      Ankle eversion       (Blank rows = not tested)  LUMBAR SPECIAL TESTS:    FUNCTIONAL TESTS:    GAIT: Distance walked: in clinic distances  Assistive device utilized: None Level of assistance: Complete Independence Comments: antalgic, limited hip rotation, flexed at hips, holds trunk very rigid     TODAY'S TREATMENT  Pt seen for aquatic therapy today.  Treatment took place in water 3.25-76ft 8" in depth at the pool. Temp of water was 92.  Pt entered/exited the pool via stairs independently with bilat rail.   * straddling yellow noodle, holding corner:  gentle cycling,hip abdct/add, CC ski with legs suspended - 2 rounds * without support, submerged to chest:  forward/ backward high knee marching with reciprocal arm swing;  side stepping with rainbow hand  buoys and shoulder add * forward leans, with hip hinge, arms on kick board x 10 * lumbar rotation (45R/L) with kickboard upright.  *plank with hands on bench, with hip ext, cues for neutral spine * holding wall:  hip openers and crosses to decrease tightness in LB from previous exercise *STS with adductor set 3rd step  x 10. Cues for weight shift and  gaining immediate standing balance, * forward step ups with reciprocal pattern ( up 6) and retro step down x 3  reps, forward step up (6) and forward step down * return to walking forward/backward for rest and recovery                 Pt requires the buoyancy and hydrostatic pressure of water for support, and to offload joints by unweighting joint load by at least 50 % in navel deep water and by at least 75-80% in chest to neck deep water.  Viscosity of the water is needed for resistance of strengthening. Water current perturbations provides challenge to standing balance requiring increased core activation     PATIENT EDUCATION:  Education details: aquatics progression  Person educated: Patient Education method: Customer service manager Education comprehension: verbalized understanding and returned demonstration   HOME EXERCISE PROGRAM: [id number]   ASSESSMENT:  CLINICAL IMPRESSION: Pt had some increased tightness / discomfort across lower back with plank hip ext; reduced with hip openers (marching) and STS.  Improved form with STS and stairs.  Pt was pain free by end of session.   Progressing well towards remaining goals.    Per PT on 10/11/21, plan to extend therapy to the end of October, however if she does not show further objective improvement at that time we will need to DC/refer back to referring MD.   OBJECTIVE IMPAIRMENTS Abnormal gait, difficulty walking, decreased ROM, decreased strength, hypomobility, increased fascial restrictions, increased muscle spasms, impaired flexibility, impaired sensation, improper body mechanics, postural dysfunction, obesity, and pain.   ACTIVITY LIMITATIONS carrying, lifting, bending, sitting, standing, squatting, stairs, transfers, locomotion level, and caring for others  PARTICIPATION LIMITATIONS: driving, shopping, community activity, occupation, and yard work  PERSONAL FACTORS Age, Fitness, Past/current experiences, Profession, and Time since onset of injury/illness/exacerbation are also affecting patient's functional outcome.   REHAB POTENTIAL:  Good  CLINICAL DECISION MAKING: Stable/uncomplicated  EVALUATION COMPLEXITY: Low   GOALS: Goals reviewed with patient? Yes  SHORT TERM GOALS: Target date: 09/13/2021  Will be compliant with appropriate progressive HEP  Baseline: Goal status: 10/3- ONGOING, doing some of HEP daily   2.  Pain to be no more than 4/10 at worst and sciatic pain with have improved by 50% in intensity  Baseline: Goal status: 10/3- ONGOING still gets to 9-10/10 at worst, sciatic pain has good and bad days. Pain depends on the day   3.  Will have better understanding of posture and biomechanics  Baseline:  Goal status: 10/3- ONGOING, progressing   4.  Will be able to climb flight of step with U rail and step to pattern without increase in pain  Baseline:  Goal status: 10/3- ONGOING, stairs are still difficult, no significant change     LONG TERM GOALS: Target date: 10/11/2021  MMT to be 5/5 globally  Baseline:  Goal status: IN PROGRESS 10/3 no significant change   2.  Pain to be no more than 2/10 at worst with functional task performance  Baseline:  Goal status: 10/3- ONGOING can still get to 2/10  3.  Will be able to ascend/descent full flight of steps with no rails and reciprocal pattern, no increase in pain  Baseline:  Goal status: 10/3 ONGOING, still difficult   4.  Will demonstrate ability to perform mock/simulated patient transfers with good mechanics in order to assist in preventing injury at her job  Baseline:  Goal status: IN PROGRESS 10/3- still getting a spasm/sharp pains when transferring, usually gets some  help from other staff for transfers for safety      PLAN: PT FREQUENCY: 2x/week  PT DURATION: 4 weeks  PLANNED INTERVENTIONS: Therapeutic exercises, Therapeutic activity, Neuromuscular re-education, Balance training, Gait training, Patient/Family education, Self Care, Joint mobilization, Stair training, DME instructions, Aquatic Therapy, Dry Needling, Electrical stimulation,  Spinal mobilization, Cryotherapy, Moist heat, Taping, Traction, Ultrasound, Ionotophoresis 4mg /ml Dexamethasone, Manual therapy, and Re-evaluation.  PLAN FOR NEXT SESSION:   review body mechanics for work.   , PTA 10/25/21 10:32 AM [facility] [address] [place], [place], [place] Phone: [phone]   Fax:  [phone]

## 2021-10-27 ENCOUNTER — Encounter (HOSPITAL_BASED_OUTPATIENT_CLINIC_OR_DEPARTMENT_OTHER): Payer: Self-pay | Admitting: Physical Therapy

## 2021-10-27 ENCOUNTER — Ambulatory Visit (HOSPITAL_BASED_OUTPATIENT_CLINIC_OR_DEPARTMENT_OTHER): Payer: Commercial Managed Care - HMO | Admitting: Physical Therapy

## 2021-10-27 DIAGNOSIS — R29898 Other symptoms and signs involving the musculoskeletal system: Secondary | ICD-10-CM

## 2021-10-27 DIAGNOSIS — M5459 Other low back pain: Secondary | ICD-10-CM

## 2021-10-27 DIAGNOSIS — M6281 Muscle weakness (generalized): Secondary | ICD-10-CM

## 2021-10-27 NOTE — Therapy (Signed)
OUTPATIENT PHYSICAL THERAPY THORACOLUMBAR TREATMENT NOTE    Patient Name: Veronica Gonzales MRN: [id number] DOB:Apr 02, 1970, 51 y.o., female Today's Date: 10/27/2021   PT End of Session - 10/27/21 0957     Visit Number 16    Number of Visits 20    Date for PT Re-Evaluation 11/07/21    Authorization Time Period 08/16/21 to 10/11/21; extended to 11/07/21    PT Start Time 0949    PT Stop Time 1030    PT Time Calculation (min) 41 min    Activity Tolerance Patient tolerated treatment well    Behavior During Therapy [facility] for tasks assessed/performed                History reviewed. No pertinent past medical history. History reviewed. No pertinent surgical history. There are no problems to display for this patient.   PCP: does not currently have   REFERRING PROVIDER: Venita Lick, MD   REFERRING DIAG: M54.51 (ICD-10-CM) - Vertebrogenic low back pain   Rationale for Evaluation and Treatment Rehabilitation  THERAPY DIAG:  Other low back pain  Muscle weakness (generalized)  Other symptoms and signs involving the musculoskeletal system  ONSET DATE: 07/28/2021   SUBJECTIVE:                                                                                                                                                                                          SUBJECTIVE STATEMENT:  Pt reports she stopped taking muscle relaxers on 10/17; she reports increasing problem with bladder control while taking them.  She took a walk yesterday in neighborhood for 12 min ([place]) and did ok afterwards.   "Today is a good day". Pt has been off of work for 2 days, returns to work Advertising account executive.    PERTINENT HISTORY:  MD Summary: Veronica Gonzales returns today for follow-up. Unfortunately 3 to 5 days after she left my office she contracted COVID and was unable to attend physical therapy. She states that she has done some self-directed exercises and is noted significant improvement.   At this point I  have refilled her gabapentin as this is helping to relieve her pain and we will start formalized aquatic physical therapy. If she fails to improve or there is worsening of her symptoms she knows to contact me I will be happy to see her back. Otherwise, she can follow-up with me on an as-needed basis.     PAIN:  Are you having pain? Yes: NPRS scale: 1/10, Pain location: lower back (bilat) into Lt posterior/lateral hip Pain description: OA pain going across back, other pain is steady on L hip more of a stiff feeling  and going down leg a little  Aggravating factors: work, getting up in the morning  Relieving factors: other than pool therapy, pain meds and mm relaxers   PRECAUTIONS: None  WEIGHT BEARING RESTRICTIONS No  FALLS:  Has patient fallen in last 6 months? No  LIVING ENVIRONMENT: Lives with: lives alone Lives in: House/apartment Stairs: Yes: Internal: 16 steps; on right going up Has following equipment at home: None  OCCUPATION: nurse aide   PLOF: Independent, Independent with basic ADLs, Independent with gait, and Independent with transfers  PATIENT GOALS get pain down, be able to get upstairs easier, build endurance back up    OBJECTIVE:   DIAGNOSTIC FINDINGS:    PATIENT SURVEYS:    SCREENING FOR RED FLAGS: Bowel or bladder incontinence: No Spinal tumors: No Cauda equina syndrome: No Compression fracture: No Abdominal aneurysm: No  COGNITION:  Overall cognitive status: Within functional limits for tasks assessed     SENSATION: Not tested  MUSCLE LENGTH: Hamstrings WNL R, moderate limitation L  Piriformis WNL R, moderate limitation L  POSTURE: rounded shoulders, forward head, increased thoracic kyphosis, and flexed trunk   PALPATION: Lumbar and thoracic paraspinals tight but not TTP, no areas excessively tight or sore in B glutes or piriformis groups; 10/3- still very tight but not TTP in lumbar paraspinals   LUMBAR ROM:   Active  A/PROM  eval  10/11/21  Flexion Mild limitation, RFIS no change in pain Mild limitation, mild HS tightness   Extension WNL, REIS improvement in pain  WNL, perhaps even a bit hypermobile   Right lateral flexion Moderate limitation  Moderate limitation   Left lateral flexion Moderate limitation  Severe limitation   Right rotation    Left rotation     (Blank rows = not tested)   LOWER EXTREMITY MMT:    MMT Right eval Left eval 10/11/21 R 10/11/21 L   Hip flexion 4 4 3 3   Hip extension 3+ 3+ 3 3  Hip abduction 4+ 4+ 4+ 4  Hip adduction      Hip internal rotation      Hip external rotation      Knee flexion 4+ 4 4 4+  Knee extension 4+ 4+ 4+ 4+  Ankle dorsiflexion 5 5 5 5   Ankle plantarflexion      Ankle inversion      Ankle eversion       (Blank rows = not tested)  LUMBAR SPECIAL TESTS:    FUNCTIONAL TESTS:    GAIT: Distance walked: in clinic distances  Assistive device utilized: None Level of assistance: Complete Independence Comments: antalgic, limited hip rotation, flexed at hips, holds trunk very rigid     TODAY'S TREATMENT  Pt seen for aquatic therapy today.  Treatment took place in water 3.25-6ft 8" in depth at the pool. Temp of water was 92.  Pt entered/exited the pool via stairs independently with bilat rail.   * unsupport: walking forward/ backward  * side stepping with rainbow hand  buoys and shoulder add; high knee marching backwd/frwd with hand buoys under water * holding wall: curtsy lunges  * holding rainbow hand buoys on surface - forward walking kicks * lumbar rotation (45R/L) with kickboard upright.  * forward leans, with hip hinge, arms on kick board x 10 * staggered stance and row with kickboard, cues for form - 10rep each LE forward * forward step ups with reciprocal pattern ( up 6) and retro step down x 2 reps, forward step up  LLE x 10 * STS on 3rd step x 10reps * straddling yellow noodle, holding corner:  gentle cycling,hip abdct/add, CC  ski with legs suspended - 4 short rounds * return to walking forward/backward for rest and recovery                 Pt requires the buoyancy and hydrostatic pressure of water for support, and to offload joints by unweighting joint load by at least 50 % in navel deep water and by at least 75-80% in chest to neck deep water.  Viscosity of the water is needed for resistance of strengthening. Water current perturbations provides challenge to standing balance requiring increased core activation     PATIENT EDUCATION:  Education details: aquatics progression  Person educated: Patient Education method: Customer service manager Education comprehension: verbalized understanding and returned demonstration   HOME EXERCISE PROGRAM: [id number]   ASSESSMENT:  CLINICAL IMPRESSION: Pt arrived with low pain level and remained pain free during session. Encouraged pt to utilize lessons taught in PT during work shift (hip hinge, core engaged with pushing/pulling, etc).   Progressing well towards remaining goals.    Per PT on 10/11/21, plan to extend therapy to the end of October, however if she does not show further objective improvement at that time we will need to DC/refer back to referring MD.   OBJECTIVE IMPAIRMENTS Abnormal gait, difficulty walking, decreased ROM, decreased strength, hypomobility, increased fascial restrictions, increased muscle spasms, impaired flexibility, impaired sensation, improper body mechanics, postural dysfunction, obesity, and pain.   ACTIVITY LIMITATIONS carrying, lifting, bending, sitting, standing, squatting, stairs, transfers, locomotion level, and caring for others  PARTICIPATION LIMITATIONS: driving, shopping, community activity, occupation, and yard work  PERSONAL FACTORS Age, Fitness, Past/current experiences, Profession, and Time since onset of injury/illness/exacerbation are also affecting patient's functional outcome.   REHAB POTENTIAL: Good  CLINICAL  DECISION MAKING: Stable/uncomplicated  EVALUATION COMPLEXITY: Low   GOALS: Goals reviewed with patient? Yes  SHORT TERM GOALS: Target date: 09/13/2021  Will be compliant with appropriate progressive HEP  Baseline: Goal status: 10/3- ONGOING, doing some of HEP daily   2.  Pain to be no more than 4/10 at worst and sciatic pain with have improved by 50% in intensity  Baseline: Goal status: 10/3- ONGOING still gets to 9-10/10 at worst, sciatic pain has good and bad days. Pain depends on the day   3.  Will have better understanding of posture and biomechanics  Baseline:  Goal status: 10/3- ONGOING, progressing   4.  Will be able to climb flight of step with U rail and step to pattern without increase in pain  Baseline:  Goal status: 10/3- ONGOING, stairs are still difficult, no significant change     LONG TERM GOALS: Target date: 10/11/2021  MMT to be 5/5 globally  Baseline:  Goal status: IN PROGRESS 10/3 no significant change   2.  Pain to be no more than 2/10 at worst with functional task performance  Baseline:  Goal status: 10/3- ONGOING can still get to 2/10  3.  Will be able to ascend/descent full flight of steps with no rails and reciprocal pattern, no increase in pain  Baseline:  Goal status: 10/3 ONGOING, still difficult   4.  Will demonstrate ability to perform mock/simulated patient transfers with good mechanics in order to assist in preventing injury at her job  Baseline:  Goal status: IN PROGRESS 10/3- still getting a spasm/sharp pains when transferring, usually gets some help from other staff for transfers for  safety      PLAN: PT FREQUENCY: 2x/week  PT DURATION: 4 weeks  PLANNED INTERVENTIONS: Therapeutic exercises, Therapeutic activity, Neuromuscular re-education, Balance training, Gait training, Patient/Family education, Self Care, Joint mobilization, Stair training, DME instructions, Aquatic Therapy, Dry Needling, Electrical stimulation, Spinal  mobilization, Cryotherapy, Moist heat, Taping, Traction, Ultrasound, Ionotophoresis 4mg /ml Dexamethasone, Manual therapy, and Re-evaluation.  PLAN FOR NEXT SESSION:   review body mechanics for work.   , PTA 10/27/21 10:27 AM [facility] [address] [place], [place], [place] Phone: [phone]   Fax:  [phone]

## 2021-10-31 ENCOUNTER — Encounter (HOSPITAL_BASED_OUTPATIENT_CLINIC_OR_DEPARTMENT_OTHER): Payer: Self-pay | Admitting: Physical Therapy

## 2021-10-31 ENCOUNTER — Ambulatory Visit (HOSPITAL_BASED_OUTPATIENT_CLINIC_OR_DEPARTMENT_OTHER): Payer: Commercial Managed Care - HMO | Admitting: Physical Therapy

## 2021-10-31 DIAGNOSIS — R29898 Other symptoms and signs involving the musculoskeletal system: Secondary | ICD-10-CM

## 2021-10-31 DIAGNOSIS — M5459 Other low back pain: Secondary | ICD-10-CM

## 2021-10-31 DIAGNOSIS — M6281 Muscle weakness (generalized): Secondary | ICD-10-CM

## 2021-10-31 NOTE — Therapy (Signed)
OUTPATIENT PHYSICAL THERAPY THORACOLUMBAR TREATMENT NOTE    Patient Name: Veronica Gonzales MRN: [id number] DOB:11/30/1970, 51 y.o., female Today's Date: 10/31/2021   PT End of Session - 10/31/21 1041     Visit Number 17    Number of Visits 20    Date for PT Re-Evaluation 11/07/21    Authorization Time Period 08/16/21 to 10/11/21; extended to 11/07/21    PT Start Time 0934    PT Stop Time 1014    PT Time Calculation (min) 40 min    Activity Tolerance Patient tolerated treatment well    Behavior During Therapy [name] for tasks assessed/performed                History reviewed. No pertinent past medical history. History reviewed. No pertinent surgical history. There are no problems to display for this patient.   PCP: does not currently have   REFERRING PROVIDER: Melina Schools, MD   REFERRING DIAG: M54.51 (ICD-10-CM) - Vertebrogenic low back pain   Rationale for Evaluation and Treatment Rehabilitation  THERAPY DIAG:  Other low back pain  Muscle weakness (generalized)  Other symptoms and signs involving the musculoskeletal system  ONSET DATE: 07/28/2021   SUBJECTIVE:                                                                                                                                                                                          SUBJECTIVE STATEMENT:  Pt reports she walked for 15 min last week and it went ok.  She worked this weekend, "It (pain) was there, but I paced myself and did some exercises at work".     PERTINENT HISTORY:  MD Summary: Veronica Gonzales returns today for follow-up. Unfortunately 3 to 5 days after she left my office she contracted COVID and was unable to attend physical therapy. She states that she has done some self-directed exercises and is noted significant improvement.   At this point I have refilled her gabapentin as this is helping to relieve her pain and we will start formalized aquatic physical therapy. If she fails to improve  or there is worsening of her symptoms she knows to contact me I will be happy to see her back. Otherwise, she can follow-up with me on an as-needed basis.     PAIN:  Are you having pain? 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 No  LIVING ENVIRONMENT: Lives with: lives alone Lives in: House/apartment Stairs: Yes: Internal: 16 steps; on right going up Has following equipment at home: None  OCCUPATION: nurse aide   PLOF: Independent, Independent with basic ADLs, Independent with gait, and Independent with transfers  PATIENT GOALS get pain down, be able to get upstairs easier, build endurance back up    OBJECTIVE: *findings taken at evaluation unless otherwise noted.   DIAGNOSTIC FINDINGS:    PATIENT SURVEYS:    SCREENING FOR RED FLAGS: Bowel or bladder incontinence: No Spinal tumors: No Cauda equina syndrome: No Compression fracture: No Abdominal aneurysm: No  COGNITION:  Overall cognitive status: Within functional limits for tasks assessed     SENSATION: Not tested  MUSCLE LENGTH: Hamstrings WNL R, moderate limitation L  Piriformis WNL R, moderate limitation L  POSTURE: rounded shoulders, forward head, increased thoracic kyphosis, and flexed trunk   PALPATION: Lumbar and thoracic paraspinals tight but not TTP, no areas excessively tight or sore in B glutes or piriformis groups; 10/3- still very tight but not TTP in lumbar paraspinals   LUMBAR ROM:   Active  A/PROM  eval 10/11/21  Flexion Mild limitation, RFIS no change in pain Mild limitation, mild HS tightness   Extension WNL, REIS improvement in pain  WNL, perhaps even a bit hypermobile   Right lateral  flexion Moderate limitation  Moderate limitation   Left lateral flexion Moderate limitation  Severe limitation   Right rotation    Left rotation     (Blank rows = not tested)   LOWER EXTREMITY MMT:    MMT Right eval Left eval 10/11/21 R 10/11/21 L   Hip flexion 4 4 3 3   Hip extension 3+ 3+ 3 3  Hip abduction 4+ 4+ 4+ 4  Hip adduction      Hip internal rotation      Hip external rotation      Knee flexion 4+ 4 4 4+  Knee extension 4+ 4+ 4+ 4+  Ankle dorsiflexion 5 5 5 5   Ankle plantarflexion      Ankle inversion      Ankle eversion       (Blank rows = not tested)  LUMBAR SPECIAL TESTS:    FUNCTIONAL TESTS:    GAIT: Distance walked: in clinic distances  Assistive device utilized: None Level of assistance: Complete Independence Comments: antalgic, limited hip rotation, flexed at hips, holds trunk very rigid     TODAY'S TREATMENT  Pt seen for aquatic therapy today.  Treatment took place in water 3.25-14ft 8" in depth at the pool. Temp of water was 92.  Pt entered/exited the pool via stairs independently with bilat rail.  * forward up/down 6 stairs with bilat hand rails   * unsupport: walking forward/ backward and side stepping * side stepping with rainbow hand  buoys and shoulder add; high knee marching backwd/frwd with hand buoys under water * holding rainbow hand buoys on surface:  tandem gait forward/ backwards, 3 way leg kick x 5 each LE,  * straddling yellow noodle, holding corner:  gentle cycling,hip abdct/add, CC ski with legs suspended - 3 short rounds * forward leans, with hip hinge, arms on kick board x 10 * lumbar rotation (45R/L) with kickboard upright * TUG like exercise x 4 reps * forward walking kicks * ab set with thin square noodle pull down to thighs * staggered stance and row with kickboard, cues for form - 10rep each LE forward *  holding wall: curtsy lunges  * STS on 3rd step x 10reps, 1 rep 4th step * return to walking  forward/backward for rest and recovery                 Pt requires the buoyancy and hydrostatic pressure of water for support, and to offload joints by unweighting joint load by at least 50 % in navel deep water and by at least 75-80% in chest to neck deep water.  Viscosity of the water is needed for resistance of strengthening. Water current perturbations provides challenge to standing balance requiring increased core activation     PATIENT EDUCATION:  Education details: aquatics progression  Person educated: Patient Education method: Medical illustrator Education comprehension: verbalized understanding and returned demonstration   HOME EXERCISE PROGRAM: [id number]   ASSESSMENT:  CLINICAL IMPRESSION: Pt arrived with low pain level.  She reported tightening of hip and lower back with tandem gait; reduced with LE suspended in deeper water. Continued work on hip hinge, core engaged with pushing/pulling, etc. Significant improvement in tolerance for exercises in the water.  Able to progress with increased speed in water without evoking symptoms   Progressing well towards remaining goals.    Per PT on 10/11/21, plan to extend therapy to the end of October, however if she does not show further objective improvement at that time we will need to DC/ refer back to referring MD.   OBJECTIVE IMPAIRMENTS Abnormal gait, difficulty walking, decreased ROM, decreased strength, hypomobility, increased fascial restrictions, increased muscle spasms, impaired flexibility, impaired sensation, improper body mechanics, postural dysfunction, obesity, and pain.   ACTIVITY LIMITATIONS carrying, lifting, bending, sitting, standing, squatting, stairs, transfers, locomotion level, and caring for others  PARTICIPATION LIMITATIONS: driving, shopping, community activity, occupation, and yard work  PERSONAL FACTORS Age, Fitness, Past/current experiences, Profession, and Time since onset of  injury/illness/exacerbation are also affecting patient's functional outcome.   REHAB POTENTIAL: Good  CLINICAL DECISION MAKING: Stable/uncomplicated  EVALUATION COMPLEXITY: Low   GOALS: Goals reviewed with patient? Yes  SHORT TERM GOALS: Target date: 09/13/2021  Will be compliant with appropriate progressive HEP  Baseline: Goal status: 10/3- ONGOING, doing some of HEP daily   2.  Pain to be no more than 4/10 at worst and sciatic pain with have improved by 50% in intensity  Baseline: Goal status: 10/3- ONGOING still gets to 9-10/10 at worst, sciatic pain has good and bad days. Pain depends on the day   3.  Will have better understanding of posture and biomechanics  Baseline:  Goal status: 10/3- ONGOING, progressing   4.  Will be able to climb flight of step with U rail and step to pattern without increase in pain  Baseline:  Goal status: 10/3- ONGOING, stairs are still difficult, no significant change     LONG TERM GOALS: Target date: 10/11/2021  MMT to be 5/5 globally  Baseline:  Goal status: IN PROGRESS 10/3 no significant change   2.  Pain to be no more than 2/10 at worst with functional task performance  Baseline:  Goal status: 10/3- ONGOING can still get to 2/10  3.  Will be able to ascend/descent full flight of steps with no rails and reciprocal pattern, no increase in pain  Baseline:  Goal status: 10/3 ONGOING, still difficult   4.  Will demonstrate ability to perform mock/simulated patient transfers with good mechanics in order to assist in preventing injury at her job  Baseline:  Goal status: IN PROGRESS 10/3- still getting a  spasm/sharp pains when transferring, usually gets some help from other staff for transfers for safety      PLAN: PT FREQUENCY: 2x/week  PT DURATION: 4 weeks  PLANNED INTERVENTIONS: Therapeutic exercises, Therapeutic activity, Neuromuscular re-education, Balance training, Gait training, Patient/Family education, Self Care, Joint  mobilization, Stair training, DME instructions, Aquatic Therapy, Dry Needling, Electrical stimulation, Spinal mobilization, Cryotherapy, Moist heat, Taping, Traction, Ultrasound, Ionotophoresis 4mg /ml Dexamethasone, Manual therapy, and Re-evaluation.  PLAN FOR NEXT SESSION:   review body mechanics for work. Continue strengthening. POC ending soon.    , PTA 10/31/21 12:20 PM [facility] [address] [place], [place], [place] Phone: [phone]   Fax:  [phone]

## 2021-11-04 ENCOUNTER — Encounter (HOSPITAL_BASED_OUTPATIENT_CLINIC_OR_DEPARTMENT_OTHER): Payer: Self-pay | Admitting: Physical Therapy

## 2021-11-04 ENCOUNTER — Ambulatory Visit (HOSPITAL_BASED_OUTPATIENT_CLINIC_OR_DEPARTMENT_OTHER): Payer: Commercial Managed Care - HMO | Admitting: Physical Therapy

## 2021-11-04 DIAGNOSIS — R29898 Other symptoms and signs involving the musculoskeletal system: Secondary | ICD-10-CM

## 2021-11-04 DIAGNOSIS — M5459 Other low back pain: Secondary | ICD-10-CM

## 2021-11-04 DIAGNOSIS — M6281 Muscle weakness (generalized): Secondary | ICD-10-CM

## 2021-11-04 NOTE — Therapy (Signed)
OUTPATIENT PHYSICAL THERAPY THORACOLUMBAR TREATMENT NOTE    Patient Name: Veronica Gonzales MRN: [id number] DOB:[birth year], 51 y.o., female Today's Date: 11/04/2021   PT End of Session - 11/04/21 1040     Visit Number 18    Number of Visits 20    Date for PT Re-Evaluation 11/07/21    Authorization Time Period 08/16/21 to 10/11/21; extended to 11/07/21    PT Start Time 1020    PT Stop Time 1110    PT Time Calculation (min) 50 min    Activity Tolerance Patient tolerated treatment well    Behavior During Therapy [facility] for tasks assessed/performed                History reviewed. No pertinent past medical history. History reviewed. No pertinent surgical history. There are no problems to display for this patient.   PCP: does not currently have   REFERRING PROVIDER: Melina Schools, MD   REFERRING DIAG: M54.51 (ICD-10-CM) - Vertebrogenic low back pain   Rationale for Evaluation and Treatment Rehabilitation  THERAPY DIAG:  Other low back pain  Muscle weakness (generalized)  Other symptoms and signs involving the musculoskeletal system  ONSET DATE: 07/28/2021   SUBJECTIVE:                                                                                                                                                                                          SUBJECTIVE STATEMENT:  Pt reports she feels like her endurance is improving.  She doesn't get tired at work until 4pm instead off 11a (works 7a-7p).   PERTINENT HISTORY:  MD Summary: Veronica Gonzales returns today for follow-up. Unfortunately 3 to 5 days after she left my office she contracted COVID and was unable to attend physical therapy. She states that she has done some self-directed exercises and is noted significant improvement.   At this point I have refilled her gabapentin as this is helping to relieve her pain and we will start formalized aquatic physical therapy. If she fails to improve or there is worsening of her  symptoms she knows to contact me I will be happy to see her back. Otherwise, she can follow-up with me on an as-needed basis.     PAIN:  Are you having pain? 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 No  LIVING ENVIRONMENT: Lives with: lives alone  Lives in: House/apartment Stairs: Yes: Internal: 16 steps; on right going up Has following equipment at home: None  OCCUPATION: nurse aide   PLOF: Independent, Independent with basic ADLs, Independent with gait, and Independent with transfers  PATIENT GOALS get pain down, be able to get upstairs easier, build endurance back up    OBJECTIVE: *findings taken at evaluation unless otherwise noted.   DIAGNOSTIC FINDINGS:    PATIENT SURVEYS:    SCREENING FOR RED FLAGS: Bowel or bladder incontinence: No Spinal tumors: No Cauda equina syndrome: No Compression fracture: No Abdominal aneurysm: No  COGNITION:  Overall cognitive status: Within functional limits for tasks assessed     SENSATION: Not tested  MUSCLE LENGTH: Hamstrings WNL R, moderate limitation L  Piriformis WNL R, moderate limitation L  POSTURE: rounded shoulders, forward head, increased thoracic kyphosis, and flexed trunk   PALPATION: Lumbar and thoracic paraspinals tight but not TTP, no areas excessively tight or sore in B glutes or piriformis groups; 10/3- still very tight but not TTP in lumbar paraspinals   LUMBAR ROM:   Active  A/PROM  eval 10/11/21  Flexion Mild limitation, RFIS no change in pain Mild limitation, mild HS tightness   Extension WNL, REIS improvement in pain  WNL, perhaps even a bit hypermobile   Right lateral flexion Moderate limitation   Moderate limitation   Left lateral flexion Moderate limitation  Severe limitation   Right rotation    Left rotation     (Blank rows = not tested)   LOWER EXTREMITY MMT:    MMT Right eval Left eval 10/11/21 R 10/11/21 L   Hip flexion _0 Hip extension 3+ 3+ 3 3  Hip abduction 4+ 4+ 4+ 4  Hip adduction      Hip internal rotation      Hip external rotation      Knee flexion 4+ 4 4 4+  Knee extension 4+ 4+ 4+ 4+  Ankle dorsiflexion _1 Ankle plantarflexion      Ankle inversion      Ankle eversion       (Blank rows = not tested)  LUMBAR SPECIAL TESTS:    FUNCTIONAL TESTS:    GAIT: Distance walked: in clinic distances  Assistive device utilized: None Level of assistance: Complete Independence Comments: antalgic, limited hip rotation, flexed at hips, holds trunk very rigid     TODAY'S TREATMENT  * prior to entering water, reviewed body mechanics for work.  Instruction and return demo of assisting her pt's legs to EOB, sit to stand, stand pivot transfers.   Pt seen for aquatic therapy today.  Treatment took place in water 3.25-25f 8" [facility] Temp of water was 92.  Pt entered/exited the pool via stairs independently with bilat rail. * warm up of walking forward/ backward without support * at wall: curtsy lunges; hip IR/ER (clams) * monster walk forward with coordinated arms * holding yellow hand buoys under water at sides, marching forward/ backwards * holding yellow hand buoys at surface:  3 way leg kick x 5 x 2 each LE * side stepping with yellow hand  buoys and shoulder add * forward up/down 6 stairs with bilat hand rails   * straddling yellow noodle, holding corner:  gentle cycling,hip abdct/add, CC ski with legs suspended - 2rounds * STS on 4th step x 6, on 3rd step x 4 * at stairs: hamstring stretch, fig 4 stretch  Pt requires the buoyancy and hydrostatic pressure of water for support, and to offload  joints by unweighting joint load by at least 50 % in navel deep water and by at least 75-80% in chest to neck deep water.  Viscosity of the water is needed for resistance of strengthening. 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  Continued encouragement for bending knees/hips with core engaged during pushing/pulling, etc. Continued improvement in tolerance for exercises in the water.   Pt has met STG 3 and 4, LTG 4 and is progressing well towards remaining goals. Pt may be in good place to being transition to land based exercises.    Per PT on 10/11/21, plan to extend therapy to the end of October, however if she does not show further objective improvement at that time we will need to DC/ refer back to referring MD.   OBJECTIVE IMPAIRMENTS Abnormal gait, difficulty walking, decreased ROM, decreased strength, hypomobility, increased fascial restrictions, increased muscle spasms, impaired flexibility, impaired sensation, improper body mechanics, postural dysfunction, obesity, and pain.   ACTIVITY LIMITATIONS carrying, lifting, bending, sitting, standing, squatting, stairs, transfers, locomotion level, and caring for others  PARTICIPATION LIMITATIONS: driving, shopping, community activity, occupation, and yard work  PERSONAL FACTORS Age, Fitness, Past/current experiences, Profession, and Time since onset of injury/illness/exacerbation are also affecting patient's functional outcome.   REHAB POTENTIAL: Good  CLINICAL DECISION MAKING:  Stable/uncomplicated  EVALUATION COMPLEXITY: Low   GOALS: Goals reviewed with patient? Yes  SHORT TERM GOALS: Target date: 09/13/2021  Will be compliant with appropriate progressive HEP  Baseline: Goal status: 10/3- ONGOING, doing some of HEP daily   2.  Pain to be no more than 4/10 at worst and sciatic pain with have improved by 50% in intensity  Baseline: Goal status: 10/3- ONGOING still gets to 9-10/10 at worst, sciatic pain has good and bad days. Pain depends on the day   3.  Will have better understanding of posture and biomechanics  Baseline:  Goal status: Achieved: 11/04/21  4.  Will be able to climb flight of step with U rail and step to pattern without increase in pain  Baseline:  Goal status: Achieved: 11/04/21    LONG TERM GOALS: Target date: 10/11/2021  MMT to be 5/5 globally  Baseline:  Goal status: IN PROGRESS 10/3 no significant change   2.  Pain to be no more than 2/10 at worst with functional task performance  Baseline:  Goal status: 10/3- ONGOING can still get to 2/10  3.  Will be able to ascend/descent full flight of steps with no rails and reciprocal pattern, no increase in pain  Baseline: intermittent use of rails Goal status: Partially met - 11/04/21  4.  Will demonstrate ability to perform mock/simulated patient transfers with good mechanics in order to assist in preventing injury at her job  Baseline:  Goal status: Achieved - 11/04/21     PLAN: PT FREQUENCY: 2x/week  PT DURATION: 4 weeks  PLANNED INTERVENTIONS: Therapeutic exercises, Therapeutic activity, Neuromuscular re-education, Balance training, Gait training, Patient/Family education, Self Care, Joint mobilization, Stair training, DME instructions, Aquatic Therapy, Dry Needling, Electrical stimulation, Spinal mobilization,  Cryotherapy, Moist heat, Taping, Traction, Ultrasound, Ionotophoresis 17m/ml Dexamethasone, Manual therapy, and Re-evaluation.  PLAN FOR NEXT SESSION:   Assess  goals - POC ending.  JKerin Perna PTA 11/04/21 11:13 AM [facility] [address] [address]Phone: [phone]  Fax:  [phone]

## 2021-11-06 NOTE — Therapy (Incomplete)
OUTPATIENT PHYSICAL THERAPY THORACOLUMBAR TREATMENT NOTE    Patient Name: Veronica Gonzales MRN: [id number] DOB:Jun 17, 1970, 51 y.o., female Today's Date: 11/06/2021        No past medical history on file. No past surgical history on file. There are no problems to display for this patient.   PCP: does not currently have   REFERRING PROVIDER: Melina Schools, MD   REFERRING DIAG: M54.51 (ICD-10-CM) - Vertebrogenic low back pain   Rationale for Evaluation and Treatment Rehabilitation  THERAPY DIAG:  No diagnosis found.  ONSET DATE: 07/28/2021   SUBJECTIVE:                                                                                                                                                                                          SUBJECTIVE STATEMENT:  Pt reports she feels like her endurance is improving.  She doesn't get tired at work until 4pm instead off 11a (works 7a-7p).   PERTINENT HISTORY:  MD Summary: Kamree returns today for follow-up. Unfortunately 3 to 5 days after she left my office she contracted COVID and was unable to attend physical therapy. She states that she has done some self-directed exercises and is noted significant improvement.   At this point I have refilled her gabapentin as this is helping to relieve her pain and we will start formalized aquatic physical therapy. If she fails to improve or there is worsening of her symptoms she knows to contact me I will be happy to see her back. Otherwise, she can follow-up with me on an as-needed basis.     PAIN:  Are you having pain? 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 No  LIVING  ENVIRONMENT: Lives with: lives alone Lives in: House/apartment Stairs: Yes: Internal: 16 steps; on right going up Has following equipment at home: None  OCCUPATION: nurse aide   PLOF: Independent, Independent with basic ADLs, Independent with gait, and Independent with transfers  PATIENT GOALS get pain down, be able to get upstairs easier, build endurance back up    OBJECTIVE: *findings taken at evaluation unless otherwise noted.   DIAGNOSTIC FINDINGS:    PATIENT SURVEYS:    SCREENING FOR RED FLAGS: Bowel or bladder incontinence: No Spinal tumors: No Cauda equina syndrome: No Compression fracture: No Abdominal aneurysm: No  COGNITION:  Overall cognitive status: Within functional limits for  tasks assessed     SENSATION: Not tested  MUSCLE LENGTH: Hamstrings WNL R, moderate limitation L  Piriformis WNL R, moderate limitation L  POSTURE: rounded shoulders, forward head, increased thoracic kyphosis, and flexed trunk   PALPATION: Lumbar and thoracic paraspinals tight but not TTP, no areas excessively tight or sore in B glutes or piriformis groups; 10/3- still very tight but not TTP in lumbar paraspinals   LUMBAR ROM:   Active  A/PROM  eval 10/11/21  Flexion Mild limitation, RFIS no change in pain Mild limitation, mild HS tightness   Extension WNL, REIS improvement in pain  WNL, perhaps even a bit hypermobile   Right lateral flexion Moderate limitation  Moderate limitation   Left lateral flexion Moderate limitation  Severe limitation   Right rotation    Left rotation     (Blank rows = not tested)   LOWER EXTREMITY MMT:    MMT Right eval Left eval 10/11/21 R 10/11/21 L   Hip flexion _0 Hip extension 3+ 3+ 3 3  Hip abduction 4+ 4+ 4+ 4  Hip adduction      Hip internal rotation      Hip external rotation      Knee flexion 4+ 4 4 4+  Knee extension 4+ 4+ 4+ 4+  Ankle dorsiflexion _1 Ankle plantarflexion      Ankle inversion      Ankle eversion        (Blank rows = not tested)  LUMBAR SPECIAL TESTS:    FUNCTIONAL TESTS:    GAIT: Distance walked: in clinic distances  Assistive device utilized: None Level of assistance: Complete Independence Comments: antalgic, limited hip rotation, flexed at hips, holds trunk very rigid     TODAY'S TREATMENT  * prior to entering water, reviewed body mechanics for work.  Instruction and return demo of assisting her pt's legs to EOB, sit to stand, stand pivot transfers.   Pt seen for aquatic therapy today.  Treatment took place in water 3.25-25f 8" [facility] Temp of [facility] was 92.  Pt entered/exited the pool via stairs independently with bilat rail. * warm up of walking forward/ backward without support * at wall: curtsy lunges; hip IR/ER (clams) * monster walk forward with coordinated arms * holding yellow hand buoys under water at sides, marching forward/ backwards * holding yellow hand buoys at surface:  3 way leg kick x 5 x 2 each LE * side stepping with yellow hand  buoys and shoulder add * forward up/down 6 stairs with bilat hand rails   * straddling yellow noodle, holding corner:  gentle cycling,hip abdct/add, CC ski with legs suspended - 2rounds * STS on 4th step x 6, on 3rd step x 4 * at stairs: hamstring stretch, fig 4 stretch                 Pt requires the buoyancy and hydrostatic pressure of water for support, and to offload joints by unweighting joint load by at least 50 % in navel deep water and by at least 75-80% in chest to neck deep water.  Viscosity of the water is needed for resistance of strengthening. 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  Continued encouragement for bending knees/hips with core engaged during pushing/pulling, etc. Continued improvement in tolerance for exercises in the water.   Pt has met STG 3 and 4, LTG 4 and is progressing well towards remaining goals. Pt may be in good place to being transition to land based exercises.    Per PT on 10/11/21, plan to extend therapy to the end of October, however if she does not show further objective improvement at that time we will need to DC/ refer back to referring MD.   OBJECTIVE IMPAIRMENTS Abnormal gait, difficulty walking, decreased ROM, decreased strength, hypomobility, increased fascial restrictions, increased muscle spasms, impaired flexibility, impaired sensation, improper body mechanics, postural dysfunction, obesity, and pain.   ACTIVITY LIMITATIONS carrying, lifting, bending, sitting, standing, squatting, stairs, transfers, locomotion level, and caring for others  PARTICIPATION LIMITATIONS: driving, shopping, community activity, occupation, and yard work  PERSONAL FACTORS Age, Fitness, Past/current experiences, Profession, and Time since onset of injury/illness/exacerbation are also affecting patient's functional outcome.   REHAB POTENTIAL: Good  CLINICAL DECISION MAKING: Stable/uncomplicated  EVALUATION COMPLEXITY: Low   GOALS: Goals reviewed with patient? Yes  SHORT TERM GOALS: Target date: 09/13/2021  Will be compliant with appropriate progressive HEP  Baseline: Goal status: 10/3- ONGOING, doing some of HEP daily   2.  Pain to be no more than 4/10 at worst and sciatic pain with have improved by 50% in intensity  Baseline: Goal status: 10/3- ONGOING still gets to 9-10/10 at worst, sciatic pain has good and bad days. Pain depends on the day   3.  Will have better  understanding of posture and biomechanics  Baseline:  Goal status: Achieved: 11/04/21  4.  Will be able to climb flight of step with U rail and step to pattern without increase in pain  Baseline:  Goal status: Achieved: 11/04/21    LONG TERM GOALS: Target date: 10/11/2021  MMT to be 5/5 globally  Baseline:  Goal status: IN PROGRESS 10/3 no significant change   2.  Pain to be no more than 2/10 at worst with functional task performance  Baseline:  Goal status: 10/3- ONGOING can still get to 2/10  3.  Will be able to ascend/descent full flight of steps with no rails and reciprocal pattern, no increase in pain  Baseline: intermittent use of rails Goal status: Partially met - 11/04/21  4.  Will demonstrate ability to perform mock/simulated patient transfers with good mechanics in order to assist in preventing injury at her job  Baseline:  Goal status: Achieved - 11/04/21     PLAN: PT FREQUENCY: 2x/week  PT DURATION: 4 weeks  PLANNED INTERVENTIONS: Therapeutic exercises, Therapeutic activity, Neuromuscular re-education, Balance training, Gait training, Patient/Family education, Self Care, Joint mobilization, Stair training, DME instructions, Aquatic Therapy, Dry Needling, Electrical stimulation, Spinal mobilization, Cryotherapy, Moist heat, Taping, Traction, Ultrasound, Ionotophoresis 35m/ml Dexamethasone, Manual therapy, and Re-evaluation.  PLAN FOR NEXT SESSION:   Assess goals - POC ending.  MStanton Kidney(Hartland [place]amel [place] MPT   11/06/21 5:49 PM [facility] [address] [address]Phone: [phone]  Fax:  [phone]

## 2021-11-07 ENCOUNTER — Ambulatory Visit (HOSPITAL_BASED_OUTPATIENT_CLINIC_OR_DEPARTMENT_OTHER): Payer: Commercial Managed Care - HMO | Admitting: Physical Therapy

## 2021-11-07 ENCOUNTER — Telehealth (HOSPITAL_BASED_OUTPATIENT_CLINIC_OR_DEPARTMENT_OTHER): Payer: Self-pay | Admitting: Physical Therapy

## 2021-11-07 NOTE — Telephone Encounter (Signed)
Spoke with pt who was unaware she had an appointment this am.  She will need to be rescheduled for re-cert as she has no more visits scheduled.

## 2021-12-13 ENCOUNTER — Encounter (HOSPITAL_BASED_OUTPATIENT_CLINIC_OR_DEPARTMENT_OTHER): Payer: Self-pay | Admitting: Physical Therapy

## 2021-12-13 ENCOUNTER — Ambulatory Visit (HOSPITAL_BASED_OUTPATIENT_CLINIC_OR_DEPARTMENT_OTHER): Payer: Commercial Managed Care - HMO | Attending: Orthopedic Surgery | Admitting: Physical Therapy

## 2021-12-13 DIAGNOSIS — R29898 Other symptoms and signs involving the musculoskeletal system: Secondary | ICD-10-CM | POA: Insufficient documentation

## 2021-12-13 DIAGNOSIS — M6281 Muscle weakness (generalized): Secondary | ICD-10-CM | POA: Insufficient documentation

## 2021-12-13 DIAGNOSIS — R208 Other disturbances of skin sensation: Secondary | ICD-10-CM | POA: Diagnosis present

## 2021-12-13 DIAGNOSIS — M5459 Other low back pain: Secondary | ICD-10-CM | POA: Diagnosis present

## 2021-12-13 NOTE — Therapy (Signed)
OUTPATIENT PHYSICAL THERAPY THORACOLUMBAR TREATMENT NOTE Progress note/recert Progress Note Reporting Period 10/11/21 to 12/13/21  See note below for Objective Data and Assessment of Progress/Goals.   Patient Name: Veronica Gonzales MRN: [id number] DOB:[birth year], 51 y.o., female Today's Date: 12/13/2021   PT End of Session - 12/13/21 0858     Visit Number 19    Number of Visits 30    Date for PT Re-Evaluation 01/24/22    Authorization Time Period 08/16/21 to 10/11/21; extended to 11/07/21; extend Jan 16    PT Start Time 0900    PT Stop Time 0945    PT Time Calculation (min) 45 min    Activity Tolerance Patient tolerated treatment well    Behavior During Therapy [facility] for tasks assessed/performed                 History reviewed. No pertinent past medical history. History reviewed. No pertinent surgical history. There are no problems to display for this patient.   PCP: does not currently have   REFERRING PROVIDER: Melina Schools, MD   REFERRING DIAG: M54.51 (ICD-10-CM) - Vertebrogenic low back pain   Rationale for Evaluation and Treatment Rehabilitation  THERAPY DIAG:  Other low back pain  Muscle weakness (generalized)  Other symptoms and signs involving the musculoskeletal system  Other disturbances of skin sensation  ONSET DATE: 07/28/2021   SUBJECTIVE:                                                                                                                                                                                          SUBJECTIVE STATEMENT: Pt reports slight improvement in LBP since last session.  "I can go longer without increase in pain, my normal was 6-7/10 now is 3-4/10" Pt just finished CNA contract and will take a few days before returning to work   PERTINENT HISTORY:  MD Summary: Veronica Gonzales returns today for follow-up. Unfortunately 3 to 5 days after she left my office she contracted COVID and was unable to attend physical therapy. She  states that she has done some self-directed exercises and is noted significant improvement.   At this point I have refilled her gabapentin as this is helping to relieve her pain and we will start formalized aquatic physical therapy. If she fails to improve or there is worsening of her symptoms she knows to contact me I will be happy to see her back. Otherwise, she can follow-up with me on an as-needed basis.     PAIN:  Are you having pain? Yes: NPRS scale: 3/10, Pain location: lower back (bilat) into Lt posterior/lateral hip with radicular pattern into toes Pain description: OA  pain going across back,  Aggravating factors: work, getting up in the morning  Relieving factors: other than pool therapy, pain meds    PRECAUTIONS: None  WEIGHT BEARING RESTRICTIONS No  FALLS:  Has patient fallen in last 6 months? No  LIVING ENVIRONMENT: Lives with: lives alone Lives in: House/apartment Stairs: Yes: Internal: 16 steps; on right going up Has following equipment at home: None  OCCUPATION: nurse aide   PLOF: Independent, Independent with basic ADLs, Independent with gait, and Independent with transfers  PATIENT GOALS get pain down, be able to get upstairs easier, build endurance back up    OBJECTIVE: *findings taken at evaluation unless otherwise noted.   DIAGNOSTIC FINDINGS:    PATIENT SURVEYS: 12/13/21 Foto 18 with goal 53%  SCREENING FOR RED FLAGS: Bowel or bladder incontinence: No Spinal tumors: No Cauda equina syndrome: No Compression fracture: No Abdominal aneurysm: No  COGNITION:  Overall cognitive status: Within functional limits for tasks assessed     SENSATION: Not tested  MUSCLE LENGTH: Hamstrings WNL R, moderate limitation L  Piriformis WNL R, moderate limitation L  POSTURE: rounded shoulders, forward head, increased thoracic kyphosis, and flexed trunk   PALPATION: Lumbar and thoracic paraspinals tight but not TTP, no areas excessively tight or sore in B  glutes or piriformis groups; 10/3- still very tight but not TTP in lumbar paraspinals   LUMBAR ROM:   Active  A/PROM  eval 10/11/21 12/13/21  Flexion Mild limitation, RFIS no change in pain Mild limitation, mild HS tightness  WFL  Extension WNL, REIS improvement in pain  WNL, perhaps even a bit hypermobile  WFL  Right lateral flexion Moderate limitation  Moderate limitation  Min limitation  Left lateral flexion Moderate limitation  Severe limitation  Min limitation  Right rotation     Left rotation      (Blank rows = not tested)  **no pain with lumbar ROM 12/13/21  LOWER EXTREMITY MMT:    MMT Right eval Left eval 10/11/21 R 10/11/21 L  12/13/21 Right / Left  Hip flexion _0 4-   3+  Hip extension 3+ 3+ _1 / 3-  Hip abduction 4+ 4+ 4+ 4 5-  Hip adduction       Hip internal rotation       Hip external rotation       Knee flexion 4+ 4 4 4+ 5-  Knee extension 4+ 4+ 4+ 4+ 5-  Ankle dorsiflexion _2 Ankle plantarflexion       Ankle inversion       Ankle eversion        (Blank rows = not tested)  LUMBAR SPECIAL TESTS:    FUNCTIONAL TESTS: 12/13/21 30s STS 3.5 Berg:39/56  GAIT: Distance walked: in clinic distances  Assistive device utilized: None Level of assistance: Complete Independence Comments: antalgic, limited hip rotation, flexed at hips, holds trunk very rigid     TODAY'S TREATMENT  Re-assessment Objective testing  Pt seen for aquatic therapy today.  Treatment took place in water 3.25-55f 8" [facility] Temp of water was 92.  Pt entered/exited the pool via stairs independently with bilat rail. * straddling noodle cycling; add/abd, skiing. *walking forward, back and sidestepping.  Cues for increasing speed.                 Pt requires the buoyancy and hydrostatic pressure of water for support, and to offload joints by unweighting joint  load by at least 50 % in navel deep water and by at least 75-80% in chest to neck  deep water.  Viscosity of the water is needed for resistance of strengthening. Water current perturbations provides challenge to standing balance requiring increased core activation     PATIENT EDUCATION:  Education details: aquatics progression; Economist  Person educated: Patient Education method: Customer service manager Education comprehension: verbalized understanding and returned demonstration   HOME EXERCISE PROGRAM: [id number]   ASSESSMENT:  CLINICAL IMPRESSION: PN: Pt with a 6 week lapse in therapy due to scheduling difficulties as well as pt feeling better.  She reports she has decreased pain and improved movement but continues to have some level of impairment limiting her function.  She has a few days off from work and is hoping to return to therapy and further her progress.As demonstrated above she has had an improvement in her lumbar ROM with decreased pain. Improved general strength.  She does continue to have weakness in posterior chain with difficulty returning from forward lumbar flexion and evident in hip extension and gut strength (deficits). She continues to have a radicular pain pattern intermittently but overall pain has decreased. I did complete Foto for baseline of pt perception of her function (not completed at eval).  She reports she has been working with some difficulty due to pain but able to tolerate.  She is planning on decreasing to part time working shorter shifts which I do believe she will benefit from.  Due to her missing last 6 weeks and making some measurable progress we will extend her time out another 6 weeks with progression back onto land last 1/2 of cert. She VU of plan.      OBJECTIVE IMPAIRMENTS Abnormal gait, difficulty walking, decreased ROM, decreased strength, hypomobility, increased fascial restrictions, increased muscle spasms, impaired flexibility, impaired sensation, improper body mechanics, postural dysfunction, obesity, and pain.    ACTIVITY LIMITATIONS carrying, lifting, bending, sitting, standing, squatting, stairs, transfers, locomotion level, and caring for others  PARTICIPATION LIMITATIONS: driving, shopping, community activity, occupation, and yard work  PERSONAL FACTORS Age, Fitness, Past/current experiences, Profession, and Time since onset of injury/illness/exacerbation are also affecting patient's functional outcome.   REHAB POTENTIAL: Good  CLINICAL DECISION MAKING: Stable/uncomplicated  EVALUATION COMPLEXITY: Low   GOALS: Goals reviewed with patient? Yes  SHORT TERM GOALS: Target date: 09/13/2021  Will be compliant with appropriate progressive HEP  Baseline: Goal status: 10/3- ONGOING, doing some of HEP daily   2.  Pain to be no more than 4/10 at worst and sciatic pain with have improved by 50% in intensity  Baseline: Goal status: 10/3- ONGOING still gets to 9-10/10 at worst, sciatic pain has good and bad days. Pain depends on the day                       12/5: Achieved  3.  Will have better understanding of posture and biomechanics  Baseline:  Goal status: Achieved: 11/04/21  4.  Will be able to climb flight of step with U rail and step to pattern without increase in pain  Baseline:  Goal status: Achieved: 11/04/21    LONG TERM GOALS: Target date: 01/24/2021  MMT to be 5/5 globally  Baseline:  Goal status: IN PROGRESS improving  2.  Pain to be no more than 2/10 at worst with functional task performance  Baseline:  Goal status: 10/3- ONGOING can still get to 2/10  12/5-improving  3.  Will be able  to ascend/descent full flight of steps with no rails and reciprocal pattern, no increase in pain  Baseline: intermittent use of rails Goal status: Partially met - 11/04/21                     12/5: continues with intermittent use of rails  4.  Will demonstrate ability to perform mock/simulated patient transfers with good mechanics in order to assist in preventing injury at her job   Baseline:  Goal status: Achieved - 11/04/21  5. Pt will Improve Glute and hamstring strength up to 1 full grade to improve functional mobility  Baseline:3/3-  Goal Status: New  6. Pt will be indep with final aquatic and land based HEP to maintain gains made and have better management of chronic condition  Baseline: No aquatic program  Goal status: New  7. Pt will improve on Berg Balance test to up or > 50 to demonstrate improved balance ability and decreased fall risk.  Baseline:39/56  Goal Status: New      PLAN: PT FREQUENCY: 1-2 x week  PT DURATION: 6 weeks  PLANNED INTERVENTIONS: Therapeutic exercises, Therapeutic activity, Neuromuscular re-education, Balance training, Gait training, Patient/Family education, Self Care, Joint mobilization, Stair training, DME instructions, Aquatic Therapy, Dry Needling, Electrical stimulation, Spinal mobilization, Cryotherapy, Moist heat, Taping, Traction, Ultrasound, Ionotophoresis 37m/ml Dexamethasone, Manual therapy, and Re-evaluation.  PLAN FOR NEXT SESSION:   posterior chain strengthening, balance retraining, core strengthening  MAnnamarie Major Ladelle Teodoro MPT 12/13/21 1:38 PM CSt[facility] [address] [address]Phone: [phone]  Fax:  [phone]

## 2021-12-16 ENCOUNTER — Ambulatory Visit (HOSPITAL_BASED_OUTPATIENT_CLINIC_OR_DEPARTMENT_OTHER): Payer: Commercial Managed Care - HMO | Admitting: Physical Therapy

## 2021-12-16 DIAGNOSIS — R29898 Other symptoms and signs involving the musculoskeletal system: Secondary | ICD-10-CM

## 2021-12-16 DIAGNOSIS — M6281 Muscle weakness (generalized): Secondary | ICD-10-CM

## 2021-12-16 DIAGNOSIS — M5459 Other low back pain: Secondary | ICD-10-CM | POA: Diagnosis not present

## 2021-12-16 DIAGNOSIS — R208 Other disturbances of skin sensation: Secondary | ICD-10-CM

## 2021-12-16 NOTE — Therapy (Signed)
OUTPATIENT PHYSICAL THERAPY THORACOLUMBAR TREATMENT NOTE Progress note/recert Progress Note Reporting Period 10/11/21 to 12/13/21  See note below for Objective Data and Assessment of Progress/Goals.   Patient Name: Veronica Gonzales MRN: [id number] DOB:1970-12-07, 51 y.o., female Today's Date: 12/13/2021   PT End of Session - 12/13/21 0858     Visit Number 19    Number of Visits 30    Date for PT Re-Evaluation 01/24/22    Authorization Time Period 08/16/21 to 10/11/21; extended to 11/07/21; extend Jan 16    PT Start Time 0900    PT Stop Time 0945    PT Time Calculation (min) 45 min    Activity Tolerance Patient tolerated treatment well    Behavior During Therapy [facility] for tasks assessed/performed                 History reviewed. No pertinent past medical history. History reviewed. No pertinent surgical history. There are no problems to display for this patient.   PCP: does not currently have   REFERRING PROVIDER: Melina Schools, MD   REFERRING DIAG: M54.51 (ICD-10-CM) - Vertebrogenic low back pain   Rationale for Evaluation and Treatment Rehabilitation  THERAPY DIAG:  Other low back pain  Muscle weakness (generalized)  Other symptoms and signs involving the musculoskeletal system  Other disturbances of skin sensation  ONSET DATE: 07/28/2021   SUBJECTIVE:                                                                                                                                                                                          SUBJECTIVE STATEMENT: Pt states she continues to get a little better. Will take another week off from work   PERTINENT HISTORY:  MD Summary: Veronica Gonzales returns today for follow-up. Unfortunately 3 to 5 days after she left my office she contracted COVID and was unable to attend physical therapy. She states that she has done some self-directed exercises and is noted significant improvement.   At this point I have refilled her  gabapentin as this is helping to relieve her pain and we will start formalized aquatic physical therapy. If she fails to improve or there is worsening of her symptoms she knows to contact me I will be happy to see her back. Otherwise, she can follow-up with me on an as-needed basis.     PAIN:  Are you having pain? 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 No  LIVING ENVIRONMENT: Lives with: lives alone Lives in: House/apartment Stairs: Yes: Internal: 16 steps; on right going up Has following equipment at home: None  OCCUPATION: nurse aide   PLOF: Independent, Independent with basic ADLs, Independent with gait, and Independent with transfers  PATIENT GOALS get pain down, be able to get upstairs easier, build endurance back up    OBJECTIVE: *findings taken at evaluation unless otherwise noted.   DIAGNOSTIC FINDINGS:    PATIENT SURVEYS: 12/13/21 Foto 29 with goal 53%  SCREENING FOR RED FLAGS: Bowel or bladder incontinence: No Spinal tumors: No Cauda equina syndrome: No Compression fracture: No Abdominal aneurysm: No  COGNITION:  Overall cognitive status: Within functional limits for tasks assessed     SENSATION: Not tested  MUSCLE LENGTH: Hamstrings WNL R, moderate limitation L  Piriformis WNL R, moderate limitation L  POSTURE: rounded shoulders, forward head, increased thoracic kyphosis, and flexed trunk   PALPATION: Lumbar and thoracic paraspinals tight but not TTP, no areas excessively tight or sore in B glutes or piriformis groups; 10/3- still very tight but not TTP in lumbar paraspinals   LUMBAR ROM:   Active  A/PROM  eval  10/11/21 12/13/21  Flexion Mild limitation, RFIS no change in pain Mild limitation, mild HS tightness  WFL  Extension WNL, REIS improvement in pain  WNL, perhaps even a bit hypermobile  WFL  Right lateral flexion Moderate limitation  Moderate limitation  Min limitation  Left lateral flexion Moderate limitation  Severe limitation  Min limitation  Right rotation     Left rotation      (Blank rows = not tested)  **no pain with lumbar ROM 12/13/21  LOWER EXTREMITY MMT:    MMT Right eval Left eval 10/11/21 R 10/11/21 L  12/13/21 Right / Left  Hip flexion _0 4-   3+  Hip extension 3+ 3+ _1 / 3-  Hip abduction 4+ 4+ 4+ 4 5-  Hip adduction       Hip internal rotation       Hip external rotation       Knee flexion 4+ 4 4 4+ 5-  Knee extension 4+ 4+ 4+ 4+ 5-  Ankle dorsiflexion _2 Ankle plantarflexion       Ankle inversion       Ankle eversion        (Blank rows = not tested)  LUMBAR SPECIAL TESTS:    FUNCTIONAL TESTS: 12/13/21 30s STS 3.5 Berg:39/56  GAIT: Distance walked: in clinic distances  Assistive device utilized: None Level of assistance: Complete Independence Comments: antalgic, limited hip rotation, flexed at hips, holds trunk very rigid     TODAY'S TREATMENT  Re-assessment Objective testing  Pt seen for aquatic therapy today.  Treatment took place in water 3.25-100f 8" [facility] Temp of [facility] was 92.  Pt entered/exited the pool via stairs independently with bilat rail.   Pt seen for aquatic therapy today.  Treatment took place in water 3.25-465f8" [facility] Temp of [facility] was 92.  Pt entered/exited the pool via stairs independently with bilat rail. * warm up of walking forward/ backward without support * L stretch with tail wagging *lumbar rotation * straddling yellow noodle, holding corner:  gentle cycling,hip abdct/add, CC ski with legs suspended  *hip hinging: 2x10  cues for  targeting appropriate muscle. Demonstration for po=roper execution. *Gastroc; hamstring; adductor and quad stretches at steps *walking between exercises for recovery *core firing using ue movement in sagittal plane and and bells intervals of oscillations x 15s increased spee feet staggered then together                  Pt requires the buoyancy and hydrostatic pressure of water for support, and to offload joints by unweighting joint load by at least 50 % in navel deep water and by at least 75-80% in chest to neck deep water.  Viscosity of the water is needed for resistance of strengthening. Water current perturbations provides challenge to standing balance requiring increased core activation     PATIENT EDUCATION:  Education details: aquatics progression; Economist  Person educated: Patient Education method: Customer service manager Education comprehension: verbalized understanding and returned demonstration   HOME EXERCISE PROGRAM: [id number]   ASSESSMENT:  CLINICAL IMPRESSION: Pt tolerates increased intensity of strengthening using movement in all planes for core engagement, crossing midline, well.  Pt instructed on L and QL stretch to be completed at home.  Requires VC and demonstration for proper execution of most tasks.  Goals ongoing  PN: Pt with a 6 week lapse in therapy due to scheduling difficulties as well as pt feeling better.  She reports she has decreased pain and improved movement but continues to have some level of impairment limiting her function.  She has a few days off from work and is hoping to return to therapy and further her progress.As demonstrated above she has had an improvement in her lumbar ROM with decreased pain. Improved general strength.  She does continue to have weakness in posterior chain with difficulty returning from forward lumbar flexion and evident in hip extension and gut strength (deficits). She continues to have a radicular pain pattern  intermittently but overall pain has decreased. I did complete Foto for baseline of pt perception of her function (not completed at eval).  She reports she has been working with some difficulty due to pain but able to tolerate.  She is planning on decreasing to part time working shorter shifts which I do believe she will benefit from.  Due to her missing last 6 weeks and making some measurable progress we will extend her time out another 6 weeks with progression back onto land last 1/2 of cert. She VU of plan.      OBJECTIVE IMPAIRMENTS Abnormal gait, difficulty walking, decreased ROM, decreased strength, hypomobility, increased fascial restrictions, increased muscle spasms, impaired flexibility, impaired sensation, improper body mechanics, postural dysfunction, obesity, and pain.   ACTIVITY LIMITATIONS carrying, lifting, bending, sitting, standing, squatting, stairs, transfers, locomotion level, and caring for others  PARTICIPATION LIMITATIONS: driving, shopping, community activity, occupation, and yard work  PERSONAL FACTORS Age, Fitness, Past/current experiences, Profession, and Time since onset of injury/illness/exacerbation are also affecting patient's functional outcome.   REHAB POTENTIAL: Good  CLINICAL DECISION MAKING: Stable/uncomplicated  EVALUATION COMPLEXITY: Low   GOALS: Goals reviewed with patient? Yes  SHORT TERM GOALS: Target date: 09/13/2021  Will be compliant with appropriate progressive HEP  Baseline: Goal status: 10/3- ONGOING, doing some of HEP daily   2.  Pain to be no more than 4/10 at worst and sciatic pain with have improved by 50% in intensity  Baseline: Goal status: 10/3- ONGOING still gets to 9-10/10 at worst, sciatic pain has good and bad days. Pain depends on the day  12/5: Achieved  3.  Will have better understanding of posture and biomechanics  Baseline:  Goal status: Achieved: 11/04/21  4.  Will be able to climb flight of step  with U rail and step to pattern without increase in pain  Baseline:  Goal status: Achieved: 11/04/21    LONG TERM GOALS: Target date: 01/24/2021  MMT to be 5/5 globally  Baseline:  Goal status: IN PROGRESS improving  2.  Pain to be no more than 2/10 at worst with functional task performance  Baseline:  Goal status: 10/3- ONGOING can still get to 2/10  12/5-improving  3.  Will be able to ascend/descent full flight of steps with no rails and reciprocal pattern, no increase in pain  Baseline: intermittent use of rails Goal status: Partially met - 11/04/21                     12/5: continues with intermittent use of rails  4.  Will demonstrate ability to perform mock/simulated patient transfers with good mechanics in order to assist in preventing injury at her job  Baseline:  Goal status: Achieved - 11/04/21  5. Pt will Improve Glute and hamstring strength up to 1 full grade to improve functional mobility  Baseline:3/3-  Goal Status: New  6. Pt will be indep with final aquatic and land based HEP to maintain gains made and have better management of chronic condition  Baseline: No aquatic program  Goal status: New  7. Pt will improve on Berg Balance test to up or > 50 to demonstrate improved balance ability and decreased fall risk.  Baseline:39/56  Goal Status: New      PLAN: PT FREQUENCY: 1-2 x week  PT DURATION: 6 weeks  PLANNED INTERVENTIONS: Therapeutic exercises, Therapeutic activity, Neuromuscular re-education, Balance training, Gait training, Patient/Family education, Self Care, Joint mobilization, Stair training, DME instructions, Aquatic Therapy, Dry Needling, Electrical stimulation, Spinal mobilization, Cryotherapy, Moist heat, Taping, Traction, Ultrasound, Ionotophoresis 84m/ml Dexamethasone, Manual therapy, and Re-evaluation.  PLAN FOR NEXT SESSION:   posterior chain strengthening, balance retraining, core strengthening  MAnnamarie Major Cambell Stanek MPT 12/13/21 1:38  PM [facility] [address] [place] [address]Phone: [phone]  Fax:  [phone]

## 2021-12-20 ENCOUNTER — Ambulatory Visit (HOSPITAL_BASED_OUTPATIENT_CLINIC_OR_DEPARTMENT_OTHER): Payer: Commercial Managed Care - HMO | Admitting: Physical Therapy

## 2021-12-20 ENCOUNTER — Encounter (HOSPITAL_BASED_OUTPATIENT_CLINIC_OR_DEPARTMENT_OTHER): Payer: Self-pay | Admitting: Physical Therapy

## 2021-12-20 DIAGNOSIS — M6281 Muscle weakness (generalized): Secondary | ICD-10-CM

## 2021-12-20 DIAGNOSIS — M5459 Other low back pain: Secondary | ICD-10-CM

## 2021-12-20 DIAGNOSIS — R29898 Other symptoms and signs involving the musculoskeletal system: Secondary | ICD-10-CM

## 2021-12-20 NOTE — Therapy (Signed)
OUTPATIENT PHYSICAL THERAPY THORACOLUMBAR TREATMENT NOTE   Patient Name: Veronica Gonzales MRN: [id number] DOB:[birth year], 51 y.o., female Today's Date: 12/20/2021   PT End of Session - 12/20/21 0835     Visit Number 20    Number of Visits 30    Date for PT Re-Evaluation 01/24/22    Authorization Time Period 08/16/21 to 10/11/21; extended to 11/07/21; extended to Jan 16    PT Start Time 0819    PT Stop Time 0900    PT Time Calculation (min) 41 min    Behavior During Therapy [facility] for tasks assessed/performed                 History reviewed. No pertinent past medical history. History reviewed. No pertinent surgical history. There are no problems to display for this patient.   PCP: does not currently have   REFERRING PROVIDER: Melina Schools, MD   REFERRING DIAG: M54.51 (ICD-10-CM) - Vertebrogenic low back pain   Rationale for Evaluation and Treatment Rehabilitation  THERAPY DIAG:  Other low back pain  Muscle weakness (generalized)  Other symptoms and signs involving the musculoskeletal system  ONSET DATE: 07/28/2021   SUBJECTIVE:                                                                                                                                                                                          SUBJECTIVE STATEMENT: "It's a good day".  Pt reports she hasn't worked in a week.  Rested yesterday.     PERTINENT HISTORY:  MD Summary: Veronica Gonzales returns today for follow-up. Unfortunately 3 to 5 days after she left my office she contracted COVID and was unable to attend physical therapy. She states that she has done some self-directed exercises and is noted significant improvement.   At this point I have refilled her gabapentin as this is helping to relieve her pain and we will start formalized aquatic physical therapy. If she fails to improve or there is worsening of her symptoms she knows to contact me I will be happy to see her back. Otherwise, she can  follow-up with me on an as-needed basis.     PAIN:  Are you having pain? 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 No  LIVING ENVIRONMENT: Lives with: lives alone Lives in: House/apartment Stairs: Yes: Internal: 43  steps; on right going up Has following equipment at home: None  OCCUPATION: nurse aide   PLOF: Independent, Independent with basic ADLs, Independent with gait, and Independent with transfers  PATIENT GOALS get pain down, be able to get upstairs easier, build endurance back up    OBJECTIVE: *findings taken at evaluation unless otherwise noted.   DIAGNOSTIC FINDINGS:    PATIENT SURVEYS: 12/13/21 Foto 53 with goal 53%  SCREENING FOR RED FLAGS: Bowel or bladder incontinence: No Spinal tumors: No Cauda equina syndrome: No Compression fracture: No Abdominal aneurysm: No  COGNITION:  Overall cognitive status: Within functional limits for tasks assessed     SENSATION: Not tested  MUSCLE LENGTH: Hamstrings WNL R, moderate limitation L  Piriformis WNL R, moderate limitation L  POSTURE: rounded shoulders, forward head, increased thoracic kyphosis, and flexed trunk   PALPATION: Lumbar and thoracic paraspinals tight but not TTP, no areas excessively tight or sore in B glutes or piriformis groups; 10/3- still very tight but not TTP in lumbar paraspinals   LUMBAR ROM:   Active  A/PROM  eval 10/11/21 12/13/21  Flexion Mild limitation, RFIS no change in pain Mild limitation, mild HS tightness  WFL  Extension WNL, REIS improvement in pain  WNL, perhaps even a bit hypermobile  WFL  Right lateral flexion Moderate limitation  Moderate limitation   Min limitation  Left lateral flexion Moderate limitation  Severe limitation  Min limitation  Right rotation     Left rotation      (Blank rows = not tested)  **no pain with lumbar ROM 12/13/21  LOWER EXTREMITY MMT:    MMT Right eval Left eval 10/11/21 R 10/11/21 L  12/13/21 Right / Left  Hip flexion _0 4-   3+  Hip extension 3+ 3+ _1 / 3-  Hip abduction 4+ 4+ 4+ 4 5-  Hip adduction       Hip internal rotation       Hip external rotation       Knee flexion 4+ 4 4 4+ 5-  Knee extension 4+ 4+ 4+ 4+ 5-  Ankle dorsiflexion _2 Ankle plantarflexion       Ankle inversion       Ankle eversion        (Blank rows = not tested)  LUMBAR SPECIAL TESTS:    FUNCTIONAL TESTS: 12/13/21 30s STS 3.5 Berg:39/56  GAIT: Distance walked: in clinic distances  Assistive device utilized: None Level of assistance: Complete Independence Comments: antalgic, limited hip rotation, flexed at hips, holds trunk very rigid     TODAY'S TREATMENT  Pt seen for aquatic therapy today.  Treatment took place in water 3.25-40f 8" [facility] Temp of water was 92.  Pt entered/exited the pool via stairs independently with bilat rail.  * straddling noodle and cycling with gentle breast stroke arms; then with feet touching - reverse jumping jack, cc ski with arms and bursts of increased speed for 15 sec x 2 * walking forward/ backward  * side stepping with opp arm add/abdct with yellow -> rainbow hand floats (A,T) * holding rainbow hand floats on surface, pink fins on ankles:  SLS with hip abdct/ add, forward/backward legs swings * holding wall: hip abdct/add crossing midline with pink fins on ankles * marching forward / backward with pink fins on ankles, alternating arm punches with rainbow hand floats  - with increased speed  * R/L hamstring stretch  at stairs; fig 4 * L lumbar flexion stretch                Pt requires the buoyancy and hydrostatic pressure of water  for support, and to offload joints by unweighting joint load by at least 50 % in navel deep water and by at least 75-80% in chest to neck deep water.  Viscosity of the water is needed for resistance of strengthening. Water current perturbations provides challenge to standing balance requiring increased core activation     PATIENT EDUCATION:  Education details: aquatics progression Person educated: Patient Education method: Customer service manager Education comprehension: verbalized understanding and returned demonstration   HOME EXERCISE PROGRAM: [id number]   ASSESSMENT:  CLINICAL IMPRESSION:  Pt able to balance on noodle with cycling without use of UE on pool edge today; required some encouragement as pt reported increased anxiety without UE support when in deeper water.  Focused on increased resistance/ speed today as modification she can make when working in pool after d/c. Tolerated all exercises well, without increase in pain.  Pt making good progress towards remaining goals.   OBJECTIVE IMPAIRMENTS Abnormal gait, difficulty walking, decreased ROM, decreased strength, hypomobility, increased fascial restrictions, increased muscle spasms, impaired flexibility, impaired sensation, improper body mechanics, postural dysfunction, obesity, and pain.   ACTIVITY LIMITATIONS carrying, lifting, bending, sitting, standing, squatting, stairs, transfers, locomotion level, and caring for others  PARTICIPATION LIMITATIONS: driving, shopping, community activity, occupation, and yard work  PERSONAL FACTORS Age, Fitness, Past/current experiences, Profession, and Time since onset of injury/illness/exacerbation are also affecting patient's functional outcome.   REHAB POTENTIAL: Good  CLINICAL DECISION MAKING: Stable/uncomplicated  EVALUATION COMPLEXITY: Low   GOALS: Goals reviewed with patient? Yes  SHORT TERM GOALS: Target date: 09/13/2021  Will be compliant with appropriate progressive  HEP  Baseline: Goal status: 10/3- ONGOING, doing some of HEP daily   2.  Pain to be no more than 4/10 at worst and sciatic pain with have improved by 50% in intensity  Baseline: Goal status: 10/3- ONGOING still gets to 9-10/10 at worst, sciatic pain has good and bad days. Pain depends on the day                       12/5: Achieved  3.  Will have better understanding of posture and biomechanics  Baseline:  Goal status: Achieved: 11/04/21  4.  Will be able to climb flight of step with U rail and step to pattern without increase in pain  Baseline:  Goal status: Achieved: 11/04/21    LONG TERM GOALS: Target date: 01/24/2021  MMT to be 5/5 globally  Baseline:  Goal status: IN PROGRESS improving  2.  Pain to be no more than 2/10 at worst with functional task performance  Baseline:  Goal status: 10/3- ONGOING can still get to 2/10  12/5-improving  3.  Will be able to ascend/descent full flight of steps with no rails and reciprocal pattern, no increase in pain  Baseline: intermittent use of rails Goal status: Partially met - 11/04/21                     12/5: continues with intermittent use of rails  4.  Will demonstrate ability to perform mock/simulated patient transfers with good mechanics in order to assist in preventing injury at her job  Baseline:  Goal status: Achieved - 11/04/21  5. Pt will Improve Glute and hamstring strength up to 1 full grade to improve  functional mobility  Baseline:3/3-  Goal Status: New  6. Pt will be indep with final aquatic and land based HEP to maintain gains made and have better management of chronic condition  Baseline: No aquatic program  Goal status: New  7. Pt will improve on Berg Balance test to up or > 50 to demonstrate improved balance ability and decreased fall risk.  Baseline:39/56  Goal Status: New      PLAN: PT FREQUENCY: 1-2 x week  PT DURATION: 6 weeks  PLANNED INTERVENTIONS: Therapeutic exercises, Therapeutic activity,  Neuromuscular re-education, Balance training, Gait training, Patient/Family education, Self Care, Joint mobilization, Stair training, DME instructions, Aquatic Therapy, Dry Needling, Electrical stimulation, Spinal mobilization, Cryotherapy, Moist heat, Taping, Traction, Ultrasound, Ionotophoresis 64m/ml Dexamethasone, Manual therapy, and Re-evaluation.  PLAN FOR NEXT SESSION:   posterior chain strengthening, balance retraining, core strengthening  JKerin Perna PTA 12/20/21 10:11 AM [facility] [address] [place] [address]Phone: [phone]  Fax:  [phone]

## 2021-12-22 ENCOUNTER — Ambulatory Visit (HOSPITAL_BASED_OUTPATIENT_CLINIC_OR_DEPARTMENT_OTHER): Payer: Commercial Managed Care - HMO | Admitting: Physical Therapy

## 2021-12-22 ENCOUNTER — Encounter (HOSPITAL_BASED_OUTPATIENT_CLINIC_OR_DEPARTMENT_OTHER): Payer: Self-pay | Admitting: Physical Therapy

## 2021-12-22 DIAGNOSIS — M5459 Other low back pain: Secondary | ICD-10-CM

## 2021-12-22 DIAGNOSIS — R29898 Other symptoms and signs involving the musculoskeletal system: Secondary | ICD-10-CM

## 2021-12-22 DIAGNOSIS — M6281 Muscle weakness (generalized): Secondary | ICD-10-CM

## 2021-12-22 NOTE — Therapy (Signed)
OUTPATIENT PHYSICAL THERAPY THORACOLUMBAR TREATMENT NOTE   Patient Name: Veronica Gonzales MRN: [id number] DOB:08-14-1970, 51 y.o., female Today's Date: 12/22/2021   PT End of Session - 12/22/21 1047     Visit Number 21    Number of Visits 30    Date for PT Re-Evaluation 01/24/22    Authorization Time Period 08/16/21 to 10/11/21; extended to 11/07/21; extended to Jan 16    PT Start Time 1041   pt arrived late   PT Stop Time 1115    PT Time Calculation (min) 34 min    Activity Tolerance Patient tolerated treatment well    Behavior During Therapy [facility] for tasks assessed/performed                 History reviewed. No pertinent past medical history. History reviewed. No pertinent surgical history. There are no problems to display for this patient.   PCP: does not currently have   REFERRING PROVIDER: Melina Schools, MD   REFERRING DIAG: M54.51 (ICD-10-CM) - Vertebrogenic low back pain   Rationale for Evaluation and Treatment Rehabilitation  THERAPY DIAG:  Other low back pain  Muscle weakness (generalized)  Other symptoms and signs involving the musculoskeletal system  ONSET DATE: 07/28/2021   SUBJECTIVE:                                                                                                                                                                                          SUBJECTIVE STATEMENT: Pt joined [employer] and spent 2 hrs in classes and moving in pool on own. "I may have overdone it yesterday.  I'm stiff today".    PERTINENT HISTORY:  MD Summary: Veronica Gonzales returns today for follow-up. Unfortunately 3 to 5 days after she left my office she contracted COVID and was unable to attend physical therapy. She states that she has done some self-directed exercises and is noted significant improvement.   At this point I have refilled her gabapentin as this is helping to relieve her pain and we will start formalized aquatic physical therapy. If she fails to improve or  there is worsening of her symptoms she knows to contact me I will be happy to see her back. Otherwise, she can follow-up with me on an as-needed basis.     PAIN:  Are you having pain? 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 No  LIVING ENVIRONMENT: Lives with: lives alone Lives in: House/apartment Stairs: Yes: Internal: 16 steps; on right going up Has following equipment at home: None  OCCUPATION: nurse aide   PLOF: Independent, Independent with basic ADLs, Independent with gait, and Independent with transfers  PATIENT GOALS get pain down, be able to get upstairs easier, build endurance back up    OBJECTIVE: *findings taken at evaluation unless otherwise noted.   DIAGNOSTIC FINDINGS:    PATIENT SURVEYS: 12/13/21 Foto 83 with goal 53%  SCREENING FOR RED FLAGS: Bowel or bladder incontinence: No Spinal tumors: No Cauda equina syndrome: No Compression fracture: No Abdominal aneurysm: No  COGNITION:  Overall cognitive status: Within functional limits for tasks assessed     SENSATION: Not tested  MUSCLE LENGTH: Hamstrings WNL R, moderate limitation L  Piriformis WNL R, moderate limitation L  POSTURE: rounded shoulders, forward head, increased thoracic kyphosis, and flexed trunk   PALPATION: Lumbar and thoracic paraspinals tight but not TTP, no areas excessively tight or sore in B glutes or piriformis groups; 10/3- still very tight but not TTP in lumbar paraspinals   LUMBAR ROM:   Active  A/PROM  eval 10/11/21 12/13/21  Flexion Mild limitation, RFIS no change in pain Mild limitation, mild HS tightness  WFL  Extension WNL, REIS improvement in pain   WNL, perhaps even a bit hypermobile  WFL  Right lateral flexion Moderate limitation  Moderate limitation  Min limitation  Left lateral flexion Moderate limitation  Severe limitation  Min limitation  Right rotation     Left rotation      (Blank rows = not tested)  **no pain with lumbar ROM 12/13/21  LOWER EXTREMITY MMT:    MMT Right eval Left eval 10/11/21 R 10/11/21 L  12/13/21 Right / Left  Hip flexion _0 4-   3+  Hip extension 3+ 3+ _1 / 3-  Hip abduction 4+ 4+ 4+ 4 5-  Hip adduction       Hip internal rotation       Hip external rotation       Knee flexion 4+ 4 4 4+ 5-  Knee extension 4+ 4+ 4+ 4+ 5-  Ankle dorsiflexion _2 Ankle plantarflexion       Ankle inversion       Ankle eversion        (Blank rows = not tested)  LUMBAR SPECIAL TESTS:    FUNCTIONAL TESTS: 12/13/21 30s STS 3.5 Berg:39/56  GAIT: Distance walked: in clinic distances  Assistive device utilized: None Level of assistance: Complete Independence Comments: antalgic, limited hip rotation, flexed at hips, holds trunk very rigid     TODAY'S TREATMENT  Pt seen for aquatic therapy today.  Treatment took place in water 3.25-58f 8" [facility] Temp of water was 92.  Pt entered/exited the pool via stairs independently with bilat rail.  * short warm up of walking unsupported forward/ backward , side stepping R/L * reverse jumping jacks pulling yellow hand floats under water with core engaged (A's and T's), cues for soft landing * staggered stance with arms moving in saggital plane with resistance bells 15s intervals * forward march with forward punch with resistance bells x 2 laps,  * SLS with arms moving in transverse plane with resistance bells x 30s, LLE x 3 reps (improved with repetition), RLE x 1 rep * single resistance bell with  arm swing and hip swivel (like bat swing R/L) with feet pivoting - 60s * with rainbow hand floats for support:  SLS with  forward/backward leg swings and hip abdct/add leg swings crossing midline x 12 * lumbar flexion stretch with wag the tail, holding wall * hip hinge with arm reach with arms on yellow noodle x 10, slow speed, cues for glute/hamstring/TrA engagment * straddling noodle and cycling with gentle breast stroke arms for rest and recovery                Pt requires the buoyancy and hydrostatic pressure of water for support, and to offload joints by unweighting joint load by at least 50 % in navel deep water and by at least 75-80% in chest to neck deep water.  Viscosity of the water is needed for resistance of strengthening. Water current perturbations provides challenge to standing balance requiring increased core activation     PATIENT EDUCATION:  Education details: aquatics progression Person educated: Patient Education method: Customer service manager Education comprehension: verbalized understanding and returned demonstration   HOME EXERCISE PROGRAM: [id number]   ASSESSMENT:  CLINICAL IMPRESSION:  Session shortened due to pt's late arrival.  Continued work on core engagement while challenging balance and strengthening whole body when submerged ~70%.  Tolerated all exercises well, with decrease in pain.  Pt making good progress towards remaining goals. Discussed how she may better ease into aquatic program outside of therapy visits.   OBJECTIVE IMPAIRMENTS Abnormal gait, difficulty walking, decreased ROM, decreased strength, hypomobility, increased fascial restrictions, increased muscle spasms, impaired flexibility, impaired sensation, improper body mechanics, postural dysfunction, obesity, and pain.   ACTIVITY LIMITATIONS carrying, lifting, bending, sitting, standing, squatting, stairs, transfers, locomotion level, and caring for others  PARTICIPATION LIMITATIONS: driving, shopping, community activity, occupation, and yard work  PERSONAL FACTORS Age, Fitness, Past/current experiences,  Profession, and Time since onset of injury/illness/exacerbation are also affecting patient's functional outcome.   REHAB POTENTIAL: Good  CLINICAL DECISION MAKING: Stable/uncomplicated  EVALUATION COMPLEXITY: Low   GOALS: Goals reviewed with patient? Yes  SHORT TERM GOALS: Target date: 09/13/2021  Will be compliant with appropriate progressive HEP  Baseline: Goal status: 10/3- ONGOING, doing some of HEP daily   2.  Pain to be no more than 4/10 at worst and sciatic pain with have improved by 50% in intensity  Baseline: Goal status: 10/3- ONGOING still gets to 9-10/10 at worst, sciatic pain has good and bad days. Pain depends on the day                       12/5: Achieved  3.  Will have better understanding of posture and biomechanics  Baseline:  Goal status: Achieved: 11/04/21  4.  Will be able to climb flight of step with U rail and step to pattern without increase in pain  Baseline:  Goal status: Achieved: 11/04/21    LONG TERM GOALS: Target date: 01/24/2021  MMT to be 5/5 globally  Baseline:  Goal status: IN PROGRESS improving  2.  Pain to be no more than 2/10 at worst with functional task performance  Baseline:  Goal status: 10/3- ONGOING can still get to 2/10  12/5-improving  3.  Will be able to ascend/descent full flight of steps with no rails and reciprocal pattern, no increase in pain  Baseline: intermittent use of rails Goal status: Partially met - 11/04/21  12/5: continues with intermittent use of rails  4.  Will demonstrate ability to perform mock/simulated patient transfers with good mechanics in order to assist in preventing injury at her job  Baseline:  Goal status: Achieved - 11/04/21  5. Pt will Improve Glute and hamstring strength up to 1 full grade to improve functional mobility  Baseline:3/3-  Goal Status: New  6. Pt will be indep with final aquatic and land based HEP to maintain gains made and have better management of  chronic condition  Baseline: No aquatic program  Goal status: New  7. Pt will improve on Berg Balance test to up or > 50 to demonstrate improved balance ability and decreased fall risk.  Baseline:39/56  Goal Status: New      PLAN: PT FREQUENCY: 1-2 x week  PT DURATION: 6 weeks  PLANNED INTERVENTIONS: Therapeutic exercises, Therapeutic activity, Neuromuscular re-education, Balance training, Gait training, Patient/Family education, Self Care, Joint mobilization, Stair training, DME instructions, Aquatic Therapy, Dry Needling, Electrical stimulation, Spinal mobilization, Cryotherapy, Moist heat, Taping, Traction, Ultrasound, Ionotophoresis 63m/ml Dexamethasone, Manual therapy, and Re-evaluation.  PLAN FOR NEXT SESSION:   posterior chain strengthening, balance retraining, core strengthening  JKerin Perna PTA 12/22/21 1:20 PM [facility] [address]GN[address] [place] [address]Phone: [phone]  Fax:  [phone]

## 2021-12-26 NOTE — Therapy (Incomplete)
OUTPATIENT PHYSICAL THERAPY THORACOLUMBAR TREATMENT NOTE   Patient Name: Veronica Gonzales MRN: [id number] DOB:1970-12-20, 51 y.o., female Today's Date: 12/22/2021   PT End of Session - 12/22/21 1047     Visit Number 21    Number of Visits 30    Date for PT Re-Evaluation 01/24/22    Authorization Time Period 08/16/21 to 10/11/21; extended to 11/07/21; extended to Jan 16    PT Start Time 1041   pt arrived late   PT Stop Time 1115    PT Time Calculation (min) 34 min    Activity Tolerance Patient tolerated treatment well    Behavior During Therapy [facility] for tasks assessed/performed                 History reviewed. No pertinent past medical history. History reviewed. No pertinent surgical history. There are no problems to display for this patient.   PCP: does not currently have   REFERRING PROVIDER: Melina Schools, MD   REFERRING DIAG: M54.51 (ICD-10-CM) - Vertebrogenic low back pain   Rationale for Evaluation and Treatment Rehabilitation  THERAPY DIAG:  Other low back pain  Muscle weakness (generalized)  Other symptoms and signs involving the musculoskeletal system  ONSET DATE: 07/28/2021   SUBJECTIVE:                                                                                                                                                                                          SUBJECTIVE STATEMENT: Pt joined [employer] and spent 2 hrs in classes and moving in pool on own. "I may have overdone it yesterday.  I'm stiff today".    PERTINENT HISTORY:  MD Summary: Veronica Gonzales returns today for follow-up. Unfortunately 3 to 5 days after she left my office she contracted COVID and was unable to attend physical therapy. She states that she has done some self-directed exercises and is noted significant improvement.   At this point I have refilled her gabapentin as this is helping to relieve her pain and we will start formalized aquatic physical therapy. If she fails to improve or  there is worsening of her symptoms she knows to contact me I will be happy to see her back. Otherwise, she can follow-up with me on an as-needed basis.     PAIN:  Are you having pain? 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 No  LIVING ENVIRONMENT: Lives with: lives alone Lives in: House/apartment Stairs: Yes: Internal: 16 steps; on right going up Has following equipment at home: None  OCCUPATION: nurse aide   PLOF: Independent, Independent with basic ADLs, Independent with gait, and Independent with transfers  PATIENT GOALS get pain down, be able to get upstairs easier, build endurance back up    OBJECTIVE: *findings taken at evaluation unless otherwise noted.   DIAGNOSTIC FINDINGS:    PATIENT SURVEYS: 12/13/21 Foto 29 with goal 53%  SCREENING FOR RED FLAGS: Bowel or bladder incontinence: No Spinal tumors: No Cauda equina syndrome: No Compression fracture: No Abdominal aneurysm: No  COGNITION:  Overall cognitive status: Within functional limits for tasks assessed     SENSATION: Not tested  MUSCLE LENGTH: Hamstrings WNL R, moderate limitation L  Piriformis WNL R, moderate limitation L  POSTURE: rounded shoulders, forward head, increased thoracic kyphosis, and flexed trunk   PALPATION: Lumbar and thoracic paraspinals tight but not TTP, no areas excessively tight or sore in B glutes or piriformis groups; 10/3- still very tight but not TTP in lumbar paraspinals   LUMBAR ROM:   Active  A/PROM  eval 10/11/21 12/13/21  Flexion Mild limitation, RFIS no change in pain Mild limitation, mild HS tightness  WFL  Extension WNL, REIS improvement in pain   WNL, perhaps even a bit hypermobile  WFL  Right lateral flexion Moderate limitation  Moderate limitation  Min limitation  Left lateral flexion Moderate limitation  Severe limitation  Min limitation  Right rotation     Left rotation      (Blank rows = not tested)  **no pain with lumbar ROM 12/13/21  LOWER EXTREMITY MMT:    MMT Right eval Left eval 10/11/21 R 10/11/21 L  12/13/21 Right / Left  Hip flexion _0 4-   3+  Hip extension 3+ 3+ _1 / 3-  Hip abduction 4+ 4+ 4+ 4 5-  Hip adduction       Hip internal rotation       Hip external rotation       Knee flexion 4+ 4 4 4+ 5-  Knee extension 4+ 4+ 4+ 4+ 5-  Ankle dorsiflexion _2 Ankle plantarflexion       Ankle inversion       Ankle eversion        (Blank rows = not tested)  LUMBAR SPECIAL TESTS:    FUNCTIONAL TESTS: 12/13/21 30s STS 3.5 Berg:39/56  GAIT: Distance walked: in clinic distances  Assistive device utilized: None Level of assistance: Complete Independence Comments: antalgic, limited hip rotation, flexed at hips, holds trunk very rigid     TODAY'S TREATMENT  Pt seen for aquatic therapy today.  Treatment took place in water 3.25-28f 8" [facility] Temp of water was 92.  Pt entered/exited the pool via stairs independently with bilat rail.  * short warm up of walking unsupported forward/ backward , side stepping R/L * reverse jumping jacks pulling yellow hand floats under water with core engaged (A's and T's), cues for soft landing * staggered stance with arms moving in saggital plane with resistance bells 15s intervals * forward march with forward punch with resistance bells x 2 laps,  * SLS with arms moving in transverse plane with resistance bells x 30s, LLE x 3 reps (improved with repetition), RLE x 1 rep * single resistance bell with  arm swing and hip swivel (like bat swing R/L) with feet pivoting - 60s * with rainbow hand floats for support:  SLS with  forward/backward leg swings and hip abdct/add leg swings crossing midline x 12 * lumbar flexion stretch with wag the tail, holding wall * hip hinge with arm reach with arms on yellow noodle x 10, slow speed, cues for glute/hamstring/TrA engagment * straddling noodle and cycling with gentle breast stroke arms for rest and recovery                Pt requires the buoyancy and hydrostatic pressure of water for support, and to offload joints by unweighting joint load by at least 50 % in navel deep water and by at least 75-80% in chest to neck deep water.  Viscosity of the water is needed for resistance of strengthening. Water current perturbations provides challenge to standing balance requiring increased core activation     PATIENT EDUCATION:  Education details: aquatics progression Person educated: Patient Education method: Customer service manager Education comprehension: verbalized understanding and returned demonstration   HOME EXERCISE PROGRAM: [id number]   ASSESSMENT:  CLINICAL IMPRESSION:  Session shortened due to pt's late arrival.  Continued work on core engagement while challenging balance and strengthening whole body when submerged ~70%.  Tolerated all exercises well, with decrease in pain.  Pt making good progress towards remaining goals. Discussed how she may better ease into aquatic program outside of therapy visits.   OBJECTIVE IMPAIRMENTS Abnormal gait, difficulty walking, decreased ROM, decreased strength, hypomobility, increased fascial restrictions, increased muscle spasms, impaired flexibility, impaired sensation, improper body mechanics, postural dysfunction, obesity, and pain.   ACTIVITY LIMITATIONS carrying, lifting, bending, sitting, standing, squatting, stairs, transfers, locomotion level, and caring for others  PARTICIPATION LIMITATIONS: driving, shopping, community activity, occupation, and yard work  PERSONAL FACTORS Age, Fitness, Past/current experiences,  Profession, and Time since onset of injury/illness/exacerbation are also affecting patient's functional outcome.   REHAB POTENTIAL: Good  CLINICAL DECISION MAKING: Stable/uncomplicated  EVALUATION COMPLEXITY: Low   GOALS: Goals reviewed with patient? Yes  SHORT TERM GOALS: Target date: 09/13/2021  Will be compliant with appropriate progressive HEP  Baseline: Goal status: 10/3- ONGOING, doing some of HEP daily   2.  Pain to be no more than 4/10 at worst and sciatic pain with have improved by 50% in intensity  Baseline: Goal status: 10/3- ONGOING still gets to 9-10/10 at worst, sciatic pain has good and bad days. Pain depends on the day                       12/5: Achieved  3.  Will have better understanding of posture and biomechanics  Baseline:  Goal status: Achieved: 11/04/21  4.  Will be able to climb flight of step with U rail and step to pattern without increase in pain  Baseline:  Goal status: Achieved: 11/04/21    LONG TERM GOALS: Target date: 01/24/2021  MMT to be 5/5 globally  Baseline:  Goal status: IN PROGRESS improving  2.  Pain to be no more than 2/10 at worst with functional task performance  Baseline:  Goal status: 10/3- ONGOING can still get to 2/10  12/5-improving  3.  Will be able to ascend/descent full flight of steps with no rails and reciprocal pattern, no increase in pain  Baseline: intermittent use of rails Goal status: Partially met - 11/04/21  12/5: continues with intermittent use of rails  4.  Will demonstrate ability to perform mock/simulated patient transfers with good mechanics in order to assist in preventing injury at her job  Baseline:  Goal status: Achieved - 11/04/21  5. Pt will Improve Glute and hamstring strength up to 1 full grade to improve functional mobility  Baseline:3/3-  Goal Status: New  6. Pt will be indep with final aquatic and land based HEP to maintain gains made and have better management of  chronic condition  Baseline: No aquatic program  Goal status: New  7. Pt will improve on Berg Balance test to up or > 50 to demonstrate improved balance ability and decreased fall risk.  Baseline:39/56  Goal Status: New      PLAN: PT FREQUENCY: 1-2 x week  PT DURATION: 6 weeks  PLANNED INTERVENTIONS: Therapeutic exercises, Therapeutic activity, Neuromuscular re-education, Balance training, Gait training, Patient/Family education, Self Care, Joint mobilization, Stair training, DME instructions, Aquatic Therapy, Dry Needling, Electrical stimulation, Spinal mobilization, Cryotherapy, Moist heat, Taping, Traction, Ultrasound, Ionotophoresis 31m/ml Dexamethasone, Manual therapy, and Re-evaluation.  PLAN FOR NEXT SESSION:   posterior chain strengthening, balance retraining, core strengthening  MAnnamarie Major Winnie Umali MPT 12/27/21  [facility] [address] [address]Phone: [phone]  Fax:  [phone]

## 2021-12-27 ENCOUNTER — Ambulatory Visit (HOSPITAL_BASED_OUTPATIENT_CLINIC_OR_DEPARTMENT_OTHER): Payer: Commercial Managed Care - HMO | Admitting: Physical Therapy

## 2021-12-30 ENCOUNTER — Ambulatory Visit (HOSPITAL_BASED_OUTPATIENT_CLINIC_OR_DEPARTMENT_OTHER): Payer: Commercial Managed Care - HMO | Admitting: Physical Therapy

## 2021-12-30 IMAGING — DX DG CHEST 2V
2 series · 2 of 2 positions shown · non-contrast
Comparison: None.

CLINICAL DATA: Left chest wall swelling.

EXAM:
CHEST - 2 VIEW

[chest pa]
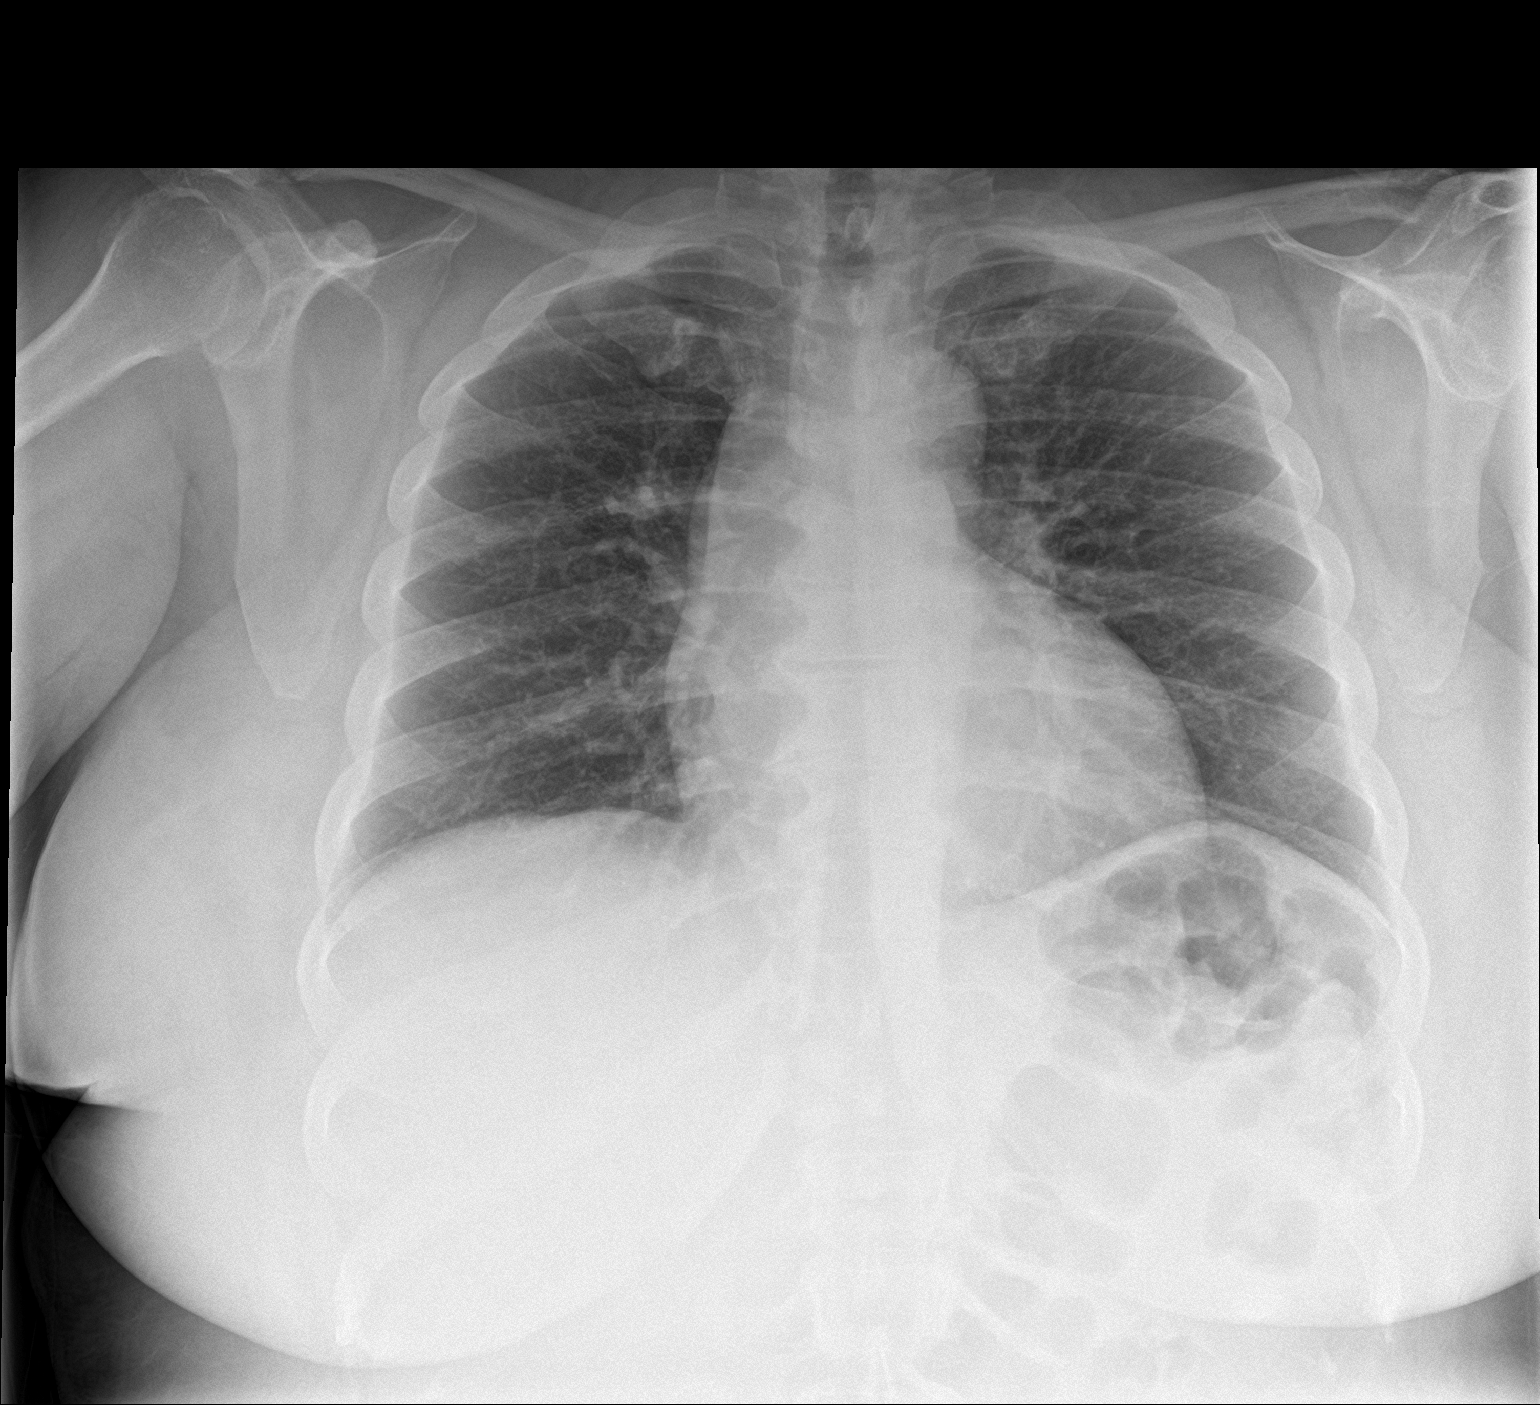

[chest lat]
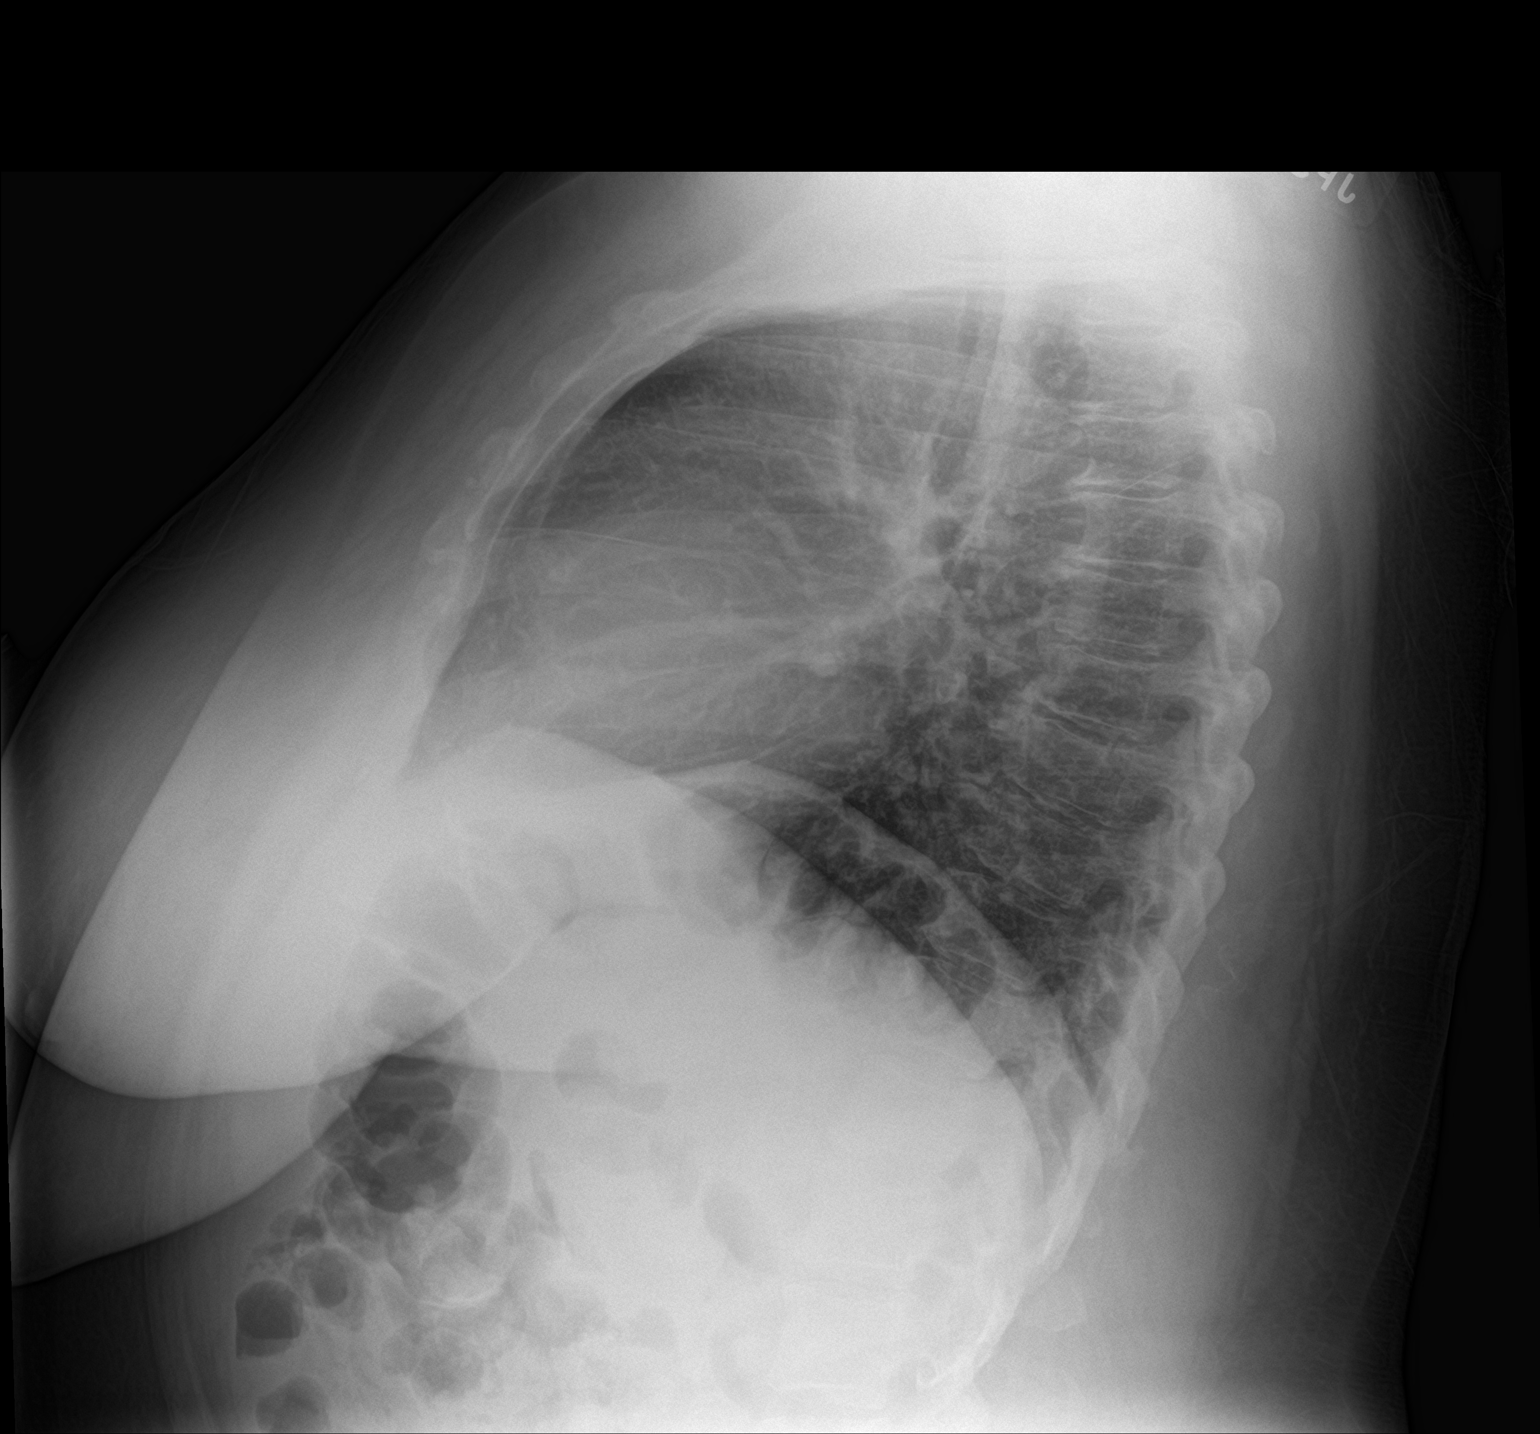

[2 of 2 positions shown; findings below may reference images not displayed]

FINDINGS: The heart size and mediastinal contours are within normal limits.
Both lungs are clear. No visible pleural effusions or pneumothorax.
The visualized skeletal structures are unremarkable.
IMPRESSION: No active cardiopulmonary disease.

## 2021-12-30 IMAGING — US US PELVIS COMPLETE
1 series · 13 of 25 positions shown · non-contrast
Comparison: None

CLINICAL DATA: Vaginal discharge and bleeding, foul smelling
discharge, lower abdominal pain; negative pregnancy test; LMP
10/26/2019

EXAM:
TRANSABDOMINAL AND TRANSVAGINAL ULTRASOUND OF PELVIS
DOPPLER ULTRASOUND OF OVARIES
TECHNIQUE: Both transabdominal and transvaginal ultrasound examinations of the
pelvis were performed. Transabdominal technique was performed for
global imaging of the pelvis including uterus, ovaries, adnexal
regions, and pelvic cul-de-sac.
It was necessary to proceed with endovaginal exam following the
transabdominal exam to visualize the endometrium, cervix, and LEFT
ovary. Color and duplex Doppler ultrasound was utilized to evaluate
blood flow to the ovaries.

[Series 1: us pelvis (transabdominal only) · 13 of 60 slices shown]
[im 1/60]
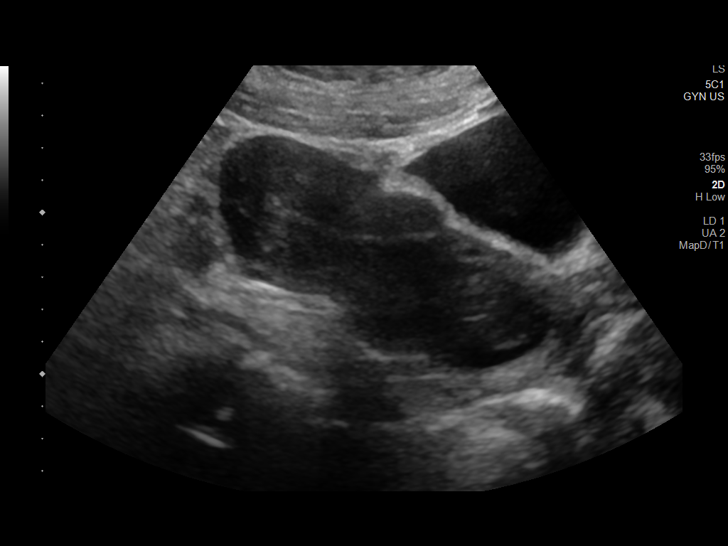
[im 5/60]
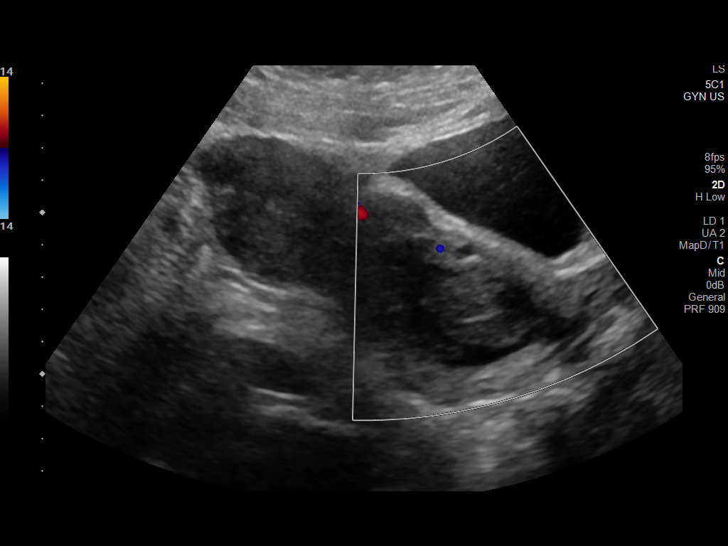
[im 10/60]
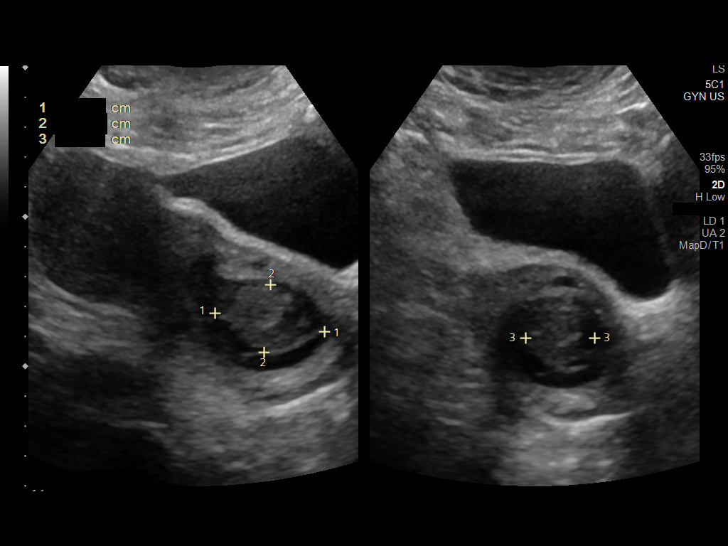
[im 15/60]
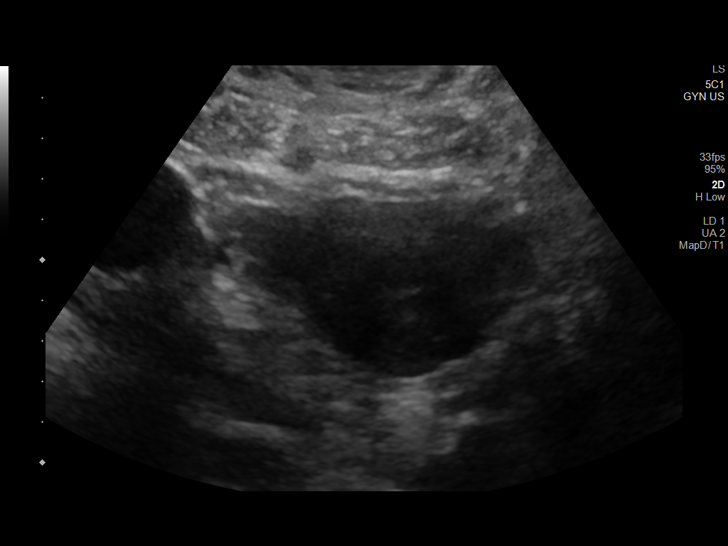
[im 20/60]
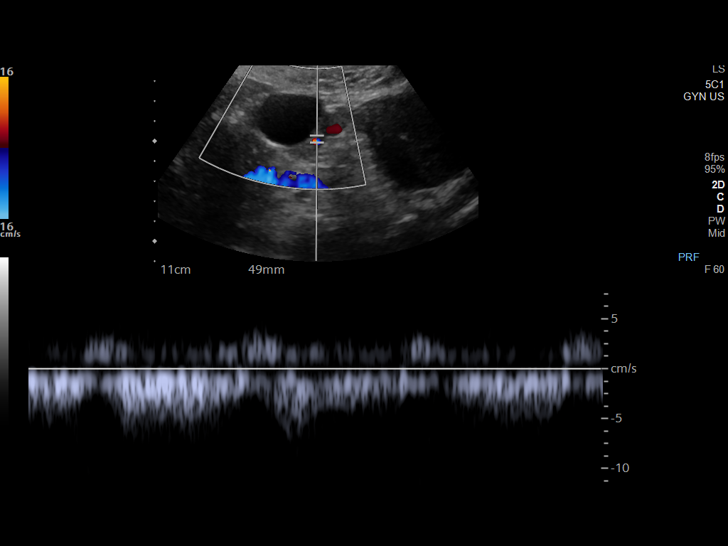
[im 25/60]
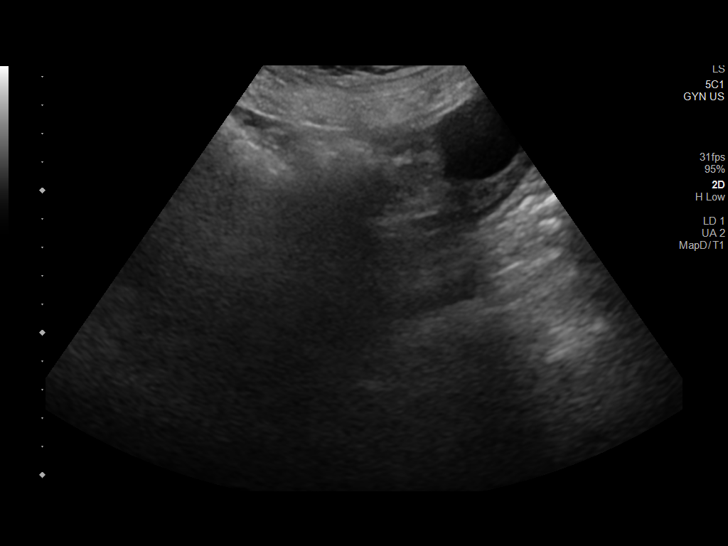
[im 30/60]
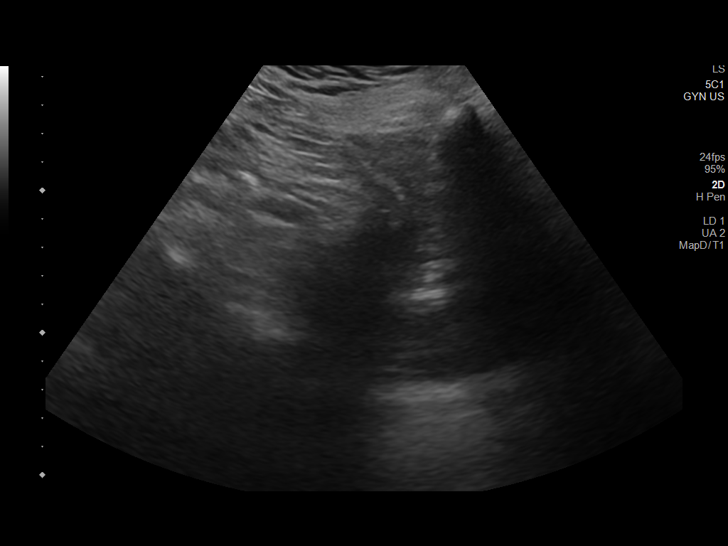
[im 35/60]
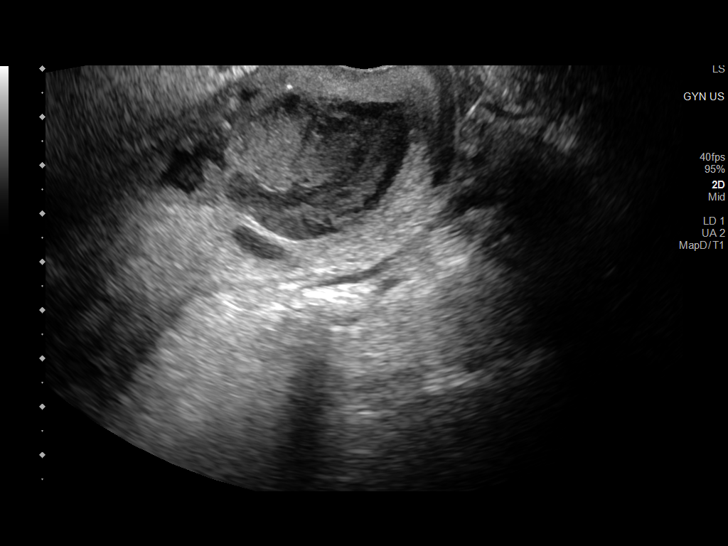
[im 40/60]
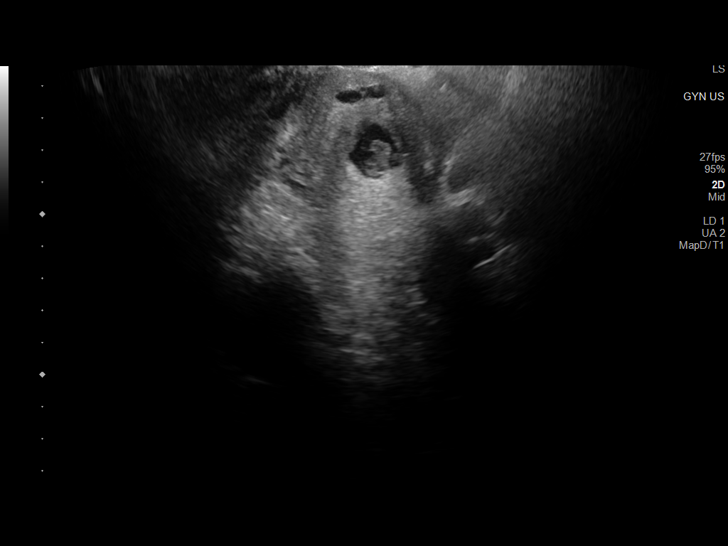
[im 45/60]
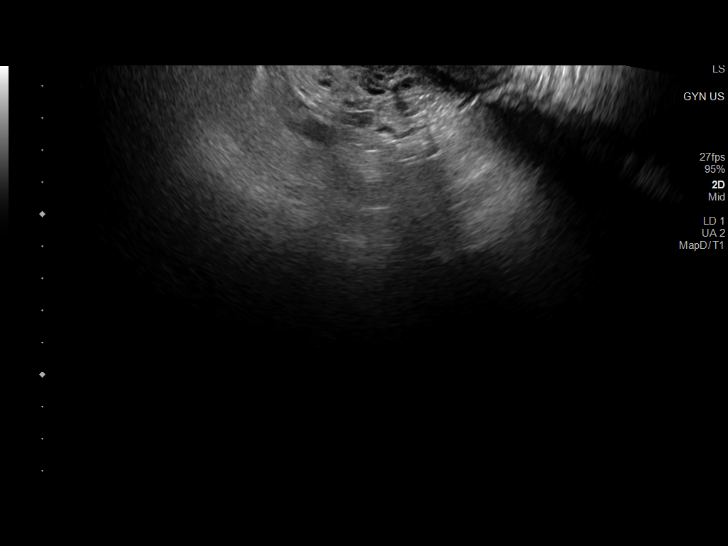
[im 50/60]
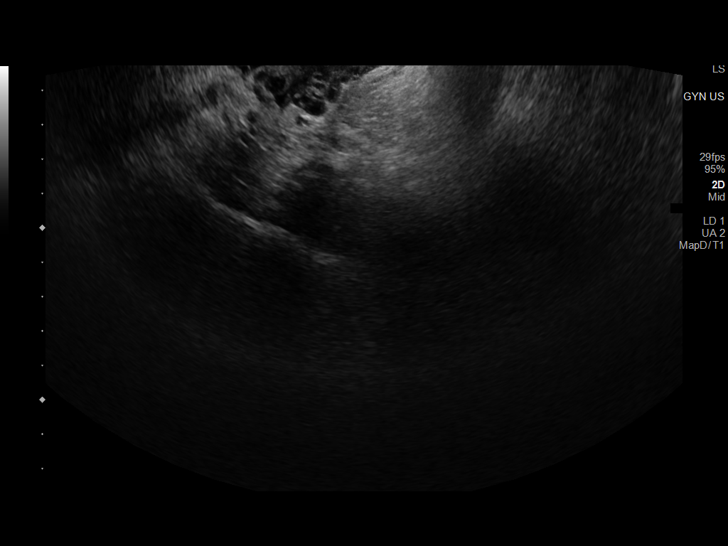
[im 55/60]
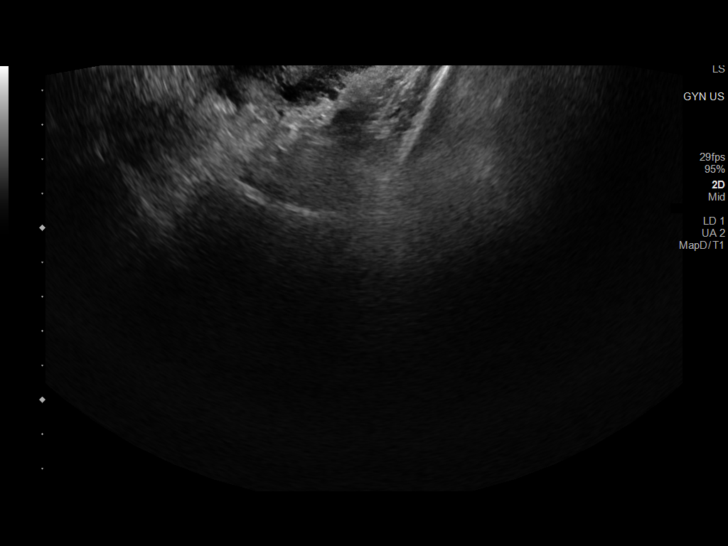
[im 60/60]
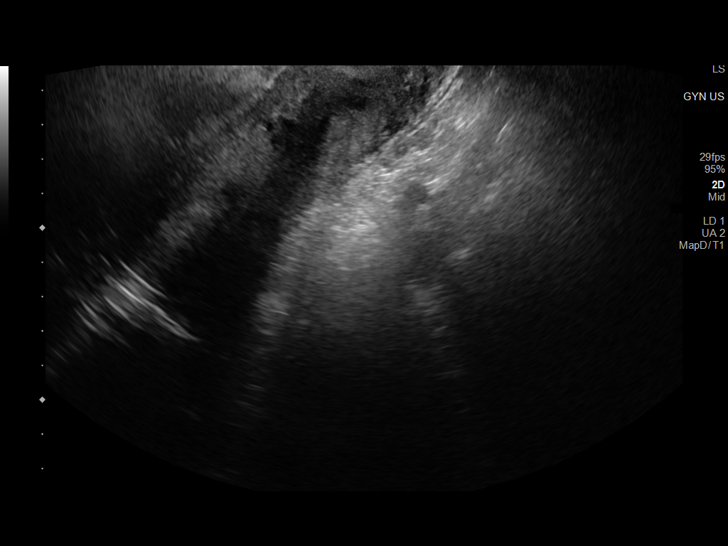

[13 of 25 positions shown; findings below may reference images not displayed]

FINDINGS: Uterus

Measurements: 12.9 x 4.4 x 5.7 cm = volume: 169 mL. Anteverted. No
focal myometrial mass.

Endometrium

Thickness: 10 mm.  No endometrial fluid or focal abnormality.

Right ovary

Measurements: 3.7 x 2.9 x 3.8 cm = volume: 21.8 mL. Dominant
physiologic follicle 2.9 cm diameter; no follow-up imaging
recommended.

Left ovary

Not visualized, likely obscured by bowel

Pulsed Doppler evaluation of RIGHT ovary demonstrates normal
low-resistance arterial and venous waveforms. LEFT ovary not
visualized for assessment

Other findings

Abnormal appearance of the cervix, distended by a combination of
heterogeneous isoechoic to hypoechoic material measuring up to 5.1 x
3.2 x 3.5 cm. A portion of this appears to represent complex fluid
question blood. An additional more echogenic focus 3.7 x 2.3 x
cm is seen which could potentially represent clot or an endocervical
polyp.
IMPRESSION: Nonvisualization of LEFT ovary.

Normal appearing uterus, endometrial complex and RIGHT ovary.

Abnormal appearance of the cervix, distended by a combination of
heterogeneous isoechoic to hypoechoic material measuring up to 5.1 x
3.2 x 3.5 cm, question blood and a 3.7 x 2.3 x 2.3 cm endocervical
polyp versus clot; if sonohysterogram or hysteroscopy is not
performed, recommend short-term follow-up ultrasound in 6 weeks to
reassess.

## 2021-12-30 IMAGING — CT CT CHEST W/ CM
3 of 5 series · 11 of 46 positions shown, 16 images · IV contrast (omnipaque)
Comparison: None.

CLINICAL DATA: Lymphadenopathy. Vaginal bleeding x2 weeks. New lump
on the left chest that is painless.

EXAM:
CT CHEST, ABDOMEN, AND PELVIS WITH CONTRAST
TECHNIQUE: Multidetector CT imaging of the chest, abdomen and pelvis was
performed following the standard protocol during bolus
administration of intravenous contrast.
CONTRAST:  100mL OMNIPAQUE IOHEXOL 300 MG/ML  SOLN

[Series 3: cap with · axial · 0.82mm/px · z∈[-408,+67]mm · 7 of 127 slices shown, 12 images]
[im 16/127  soft-tissue]
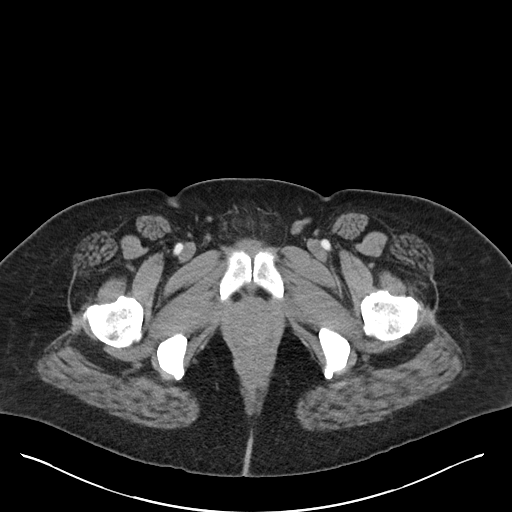
[im 16/127  bone]
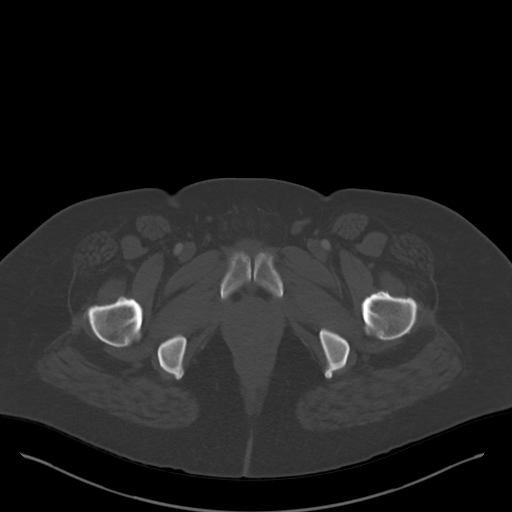
[im 32/127  soft-tissue]
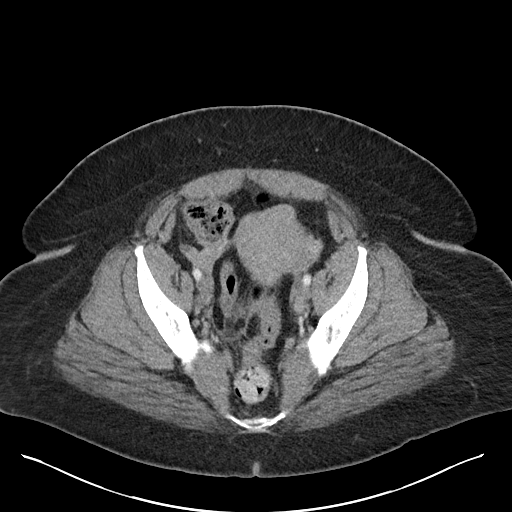
[im 48/127  soft-tissue]
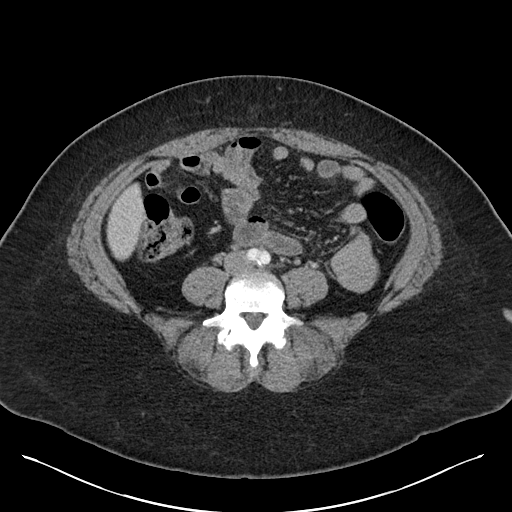
[im 64/127  soft-tissue]
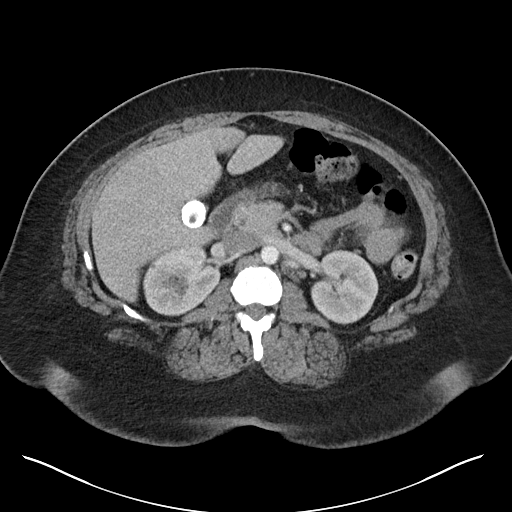
[im 64/127  lung]
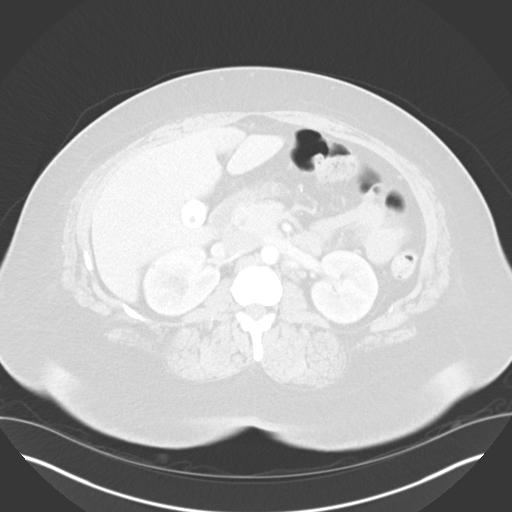
[im 79/127  soft-tissue]
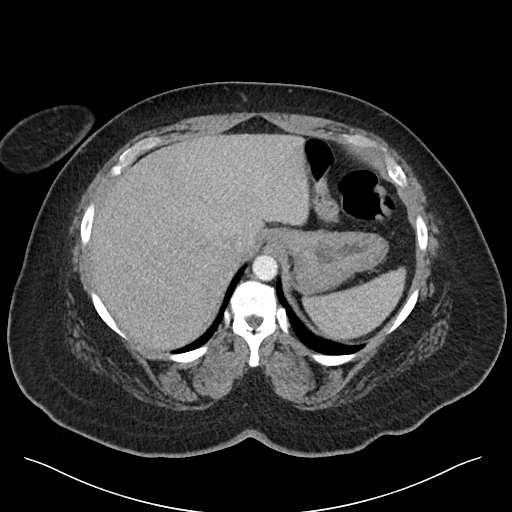
[im 79/127  lung]
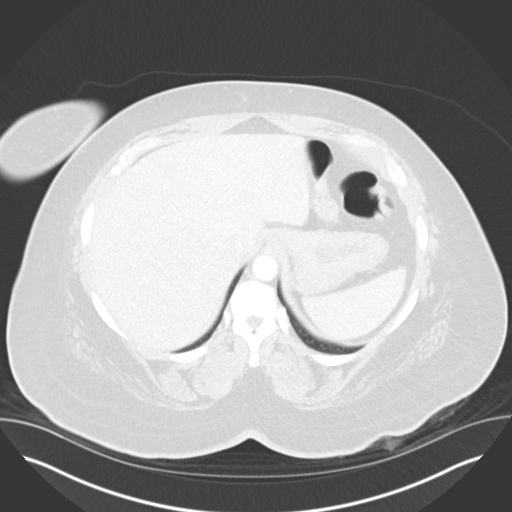
[im 95/127  soft-tissue]
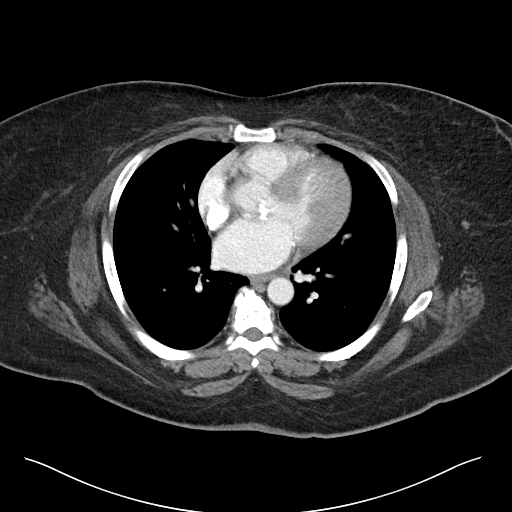
[im 95/127  lung]
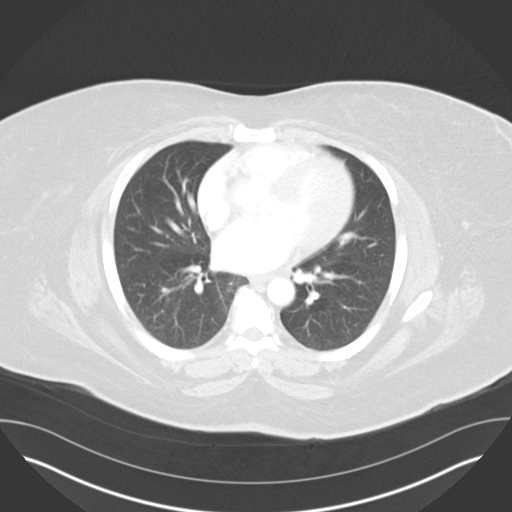
[im 111/127  soft-tissue]
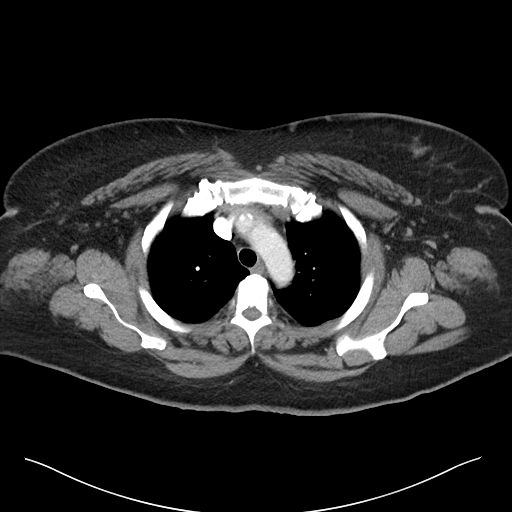
[im 111/127  lung]
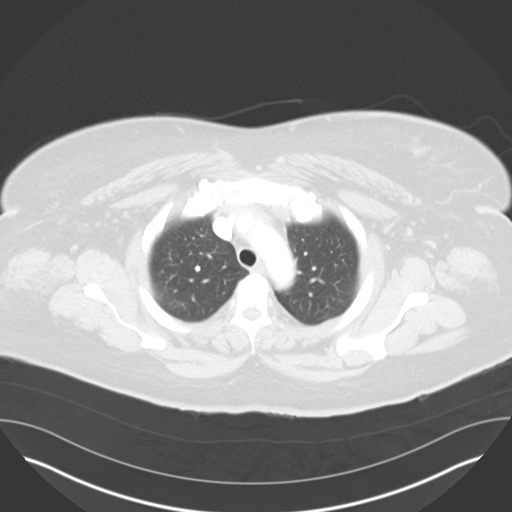

[Series 6: cor · coronal · 0.97mm/px · 3 of 110 slices shown]
[im 37/110  soft-tissue]
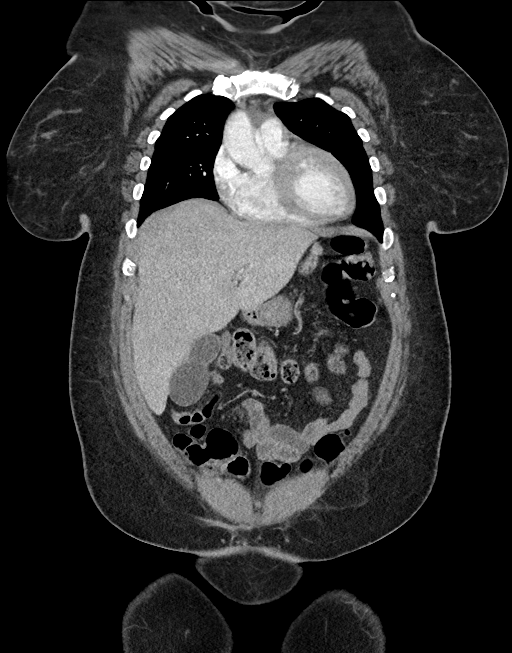
[im 49/110  soft-tissue]
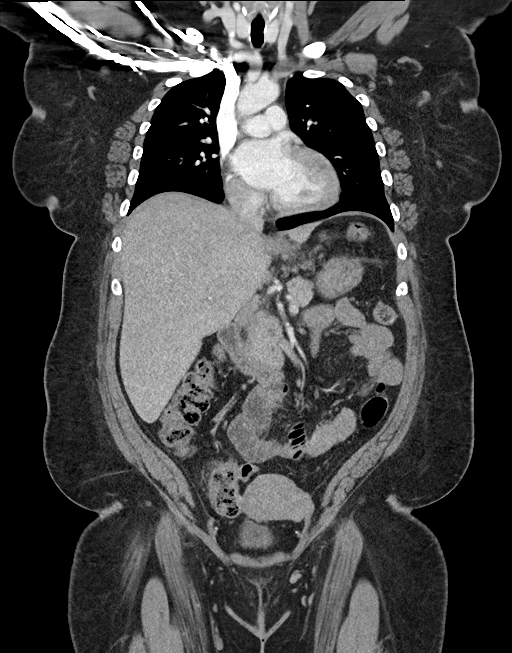
[im 61/110  soft-tissue]
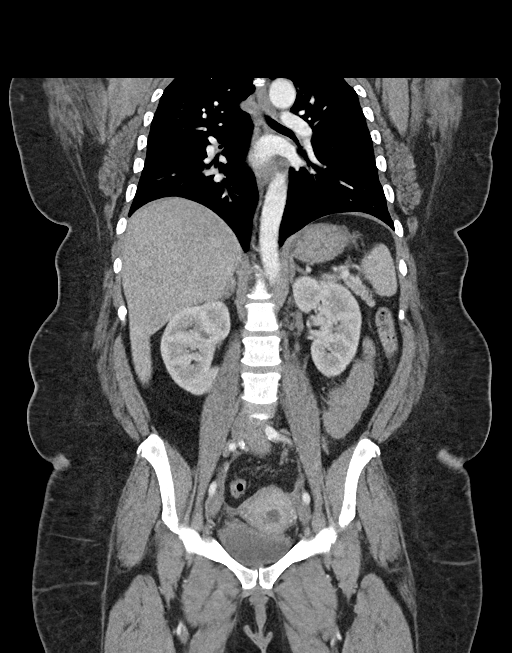

[Series 7: sag · sagittal · 0.78mm/px · 1 of 165 slices shown]
[im 55/165  soft-tissue]
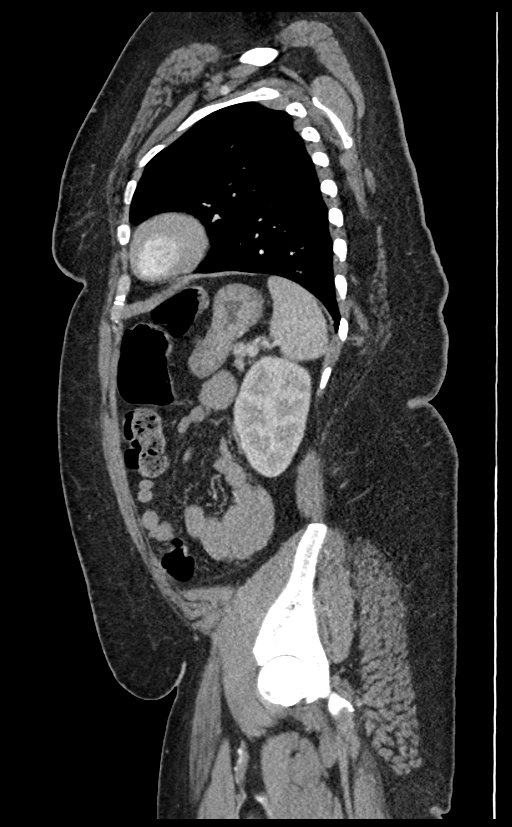

[11 of 46 positions shown; findings below may reference images not displayed]

FINDINGS: CT CHEST FINDINGS

Cardiovascular: The heart size is normal. There is no significant
pericardial effusion. No evidence for thoracic aortic aneurysm or
dissection. No large centrally located pulmonary embolism. Mild
atherosclerotic changes are noted of the thoracic aorta.

Mediastinum/Nodes:

-- No mediastinal lymphadenopathy.

-- No hilar lymphadenopathy.

-- No axillary lymphadenopathy.

-- No supraclavicular lymphadenopathy.

-- Normal thyroid gland where visualized.

-  Unremarkable esophagus.

Lungs/Pleura: Airways are patent. No pleural effusion, lobar
consolidation, pneumothorax or pulmonary infarction.

Musculoskeletal: No chest wall abnormality. No bony spinal canal
stenosis.

CT ABDOMEN PELVIS FINDINGS

Hepatobiliary: There is probable hepatic steatosis. Cholelithiasis
without acute inflammation.There is no biliary ductal dilation.

Pancreas: Normal contours without ductal dilatation. No
peripancreatic fluid collection.

Spleen: Unremarkable.

Adrenals/Urinary Tract:

--Adrenal glands: Unremarkable.

--Right kidney/ureter: No hydronephrosis or radiopaque kidney
stones.

--Left kidney/ureter: No hydronephrosis or radiopaque kidney stones.

--Urinary bladder: Unremarkable.

Stomach/Bowel:

--Stomach/Duodenum: No hiatal hernia or other gastric abnormality.
Normal duodenal course and caliber.

--Small bowel: Unremarkable.

--Colon: Unremarkable.

--Appendix: Normal.

Vascular/Lymphatic: Atherosclerotic calcification is present within
the non-aneurysmal abdominal aorta, without hemodynamically
significant stenosis.

--No retroperitoneal lymphadenopathy.

--No mesenteric lymphadenopathy.

--there is pelvic adenopathy. For example there is a left pelvic
sidewall lymph node measuring 1.2 cm (axial series 3, image 99). On
the right, there is a 1.2 cm lymph node (axial series 3, image 98).

Reproductive: Again noted is a fluid-filled cervix. There is a
low-attenuation mass at the level of the cervix measuring
approximately 5.7 x 3.2 cm. This mass appears to be septated. There
is a small amount of free fluid in the patient's pelvis.

Other: There is a fat containing umbilical hernia.

Musculoskeletal. No acute displaced fractures.
IMPRESSION: 1. Septated fluid density mass at the level of the cervix. Please
see separate ultrasound for further details. Gynecologic
consultation is recommended.
2. Pelvic adenopathy may be reactive or malignant. Attention on
follow-up examinations is recommended.
3. Cholelithiasis without acute inflammation.
4. Fat containing umbilical hernia.
5. Probable hepatic steatosis.
6. The reported left chest wall lump is not visualized on this
study. Consider further evaluation by ultrasound if this is a
palpable abnormality. If it is related to the breast, mammography is
recommended.

Aortic Atherosclerosis ([id number]).

## 2022-01-03 ENCOUNTER — Ambulatory Visit (HOSPITAL_BASED_OUTPATIENT_CLINIC_OR_DEPARTMENT_OTHER): Payer: Commercial Managed Care - HMO | Admitting: Physical Therapy

## 2022-01-05 ENCOUNTER — Ambulatory Visit (HOSPITAL_BASED_OUTPATIENT_CLINIC_OR_DEPARTMENT_OTHER): Payer: Commercial Managed Care - HMO | Admitting: Physical Therapy

## 2022-01-10 ENCOUNTER — Ambulatory Visit (HOSPITAL_BASED_OUTPATIENT_CLINIC_OR_DEPARTMENT_OTHER): Payer: Commercial Managed Care - HMO | Attending: Orthopedic Surgery | Admitting: Physical Therapy

## 2022-01-10 ENCOUNTER — Encounter (HOSPITAL_BASED_OUTPATIENT_CLINIC_OR_DEPARTMENT_OTHER): Payer: Self-pay | Admitting: Physical Therapy

## 2022-01-10 DIAGNOSIS — M5459 Other low back pain: Secondary | ICD-10-CM | POA: Insufficient documentation

## 2022-01-10 DIAGNOSIS — M6281 Muscle weakness (generalized): Secondary | ICD-10-CM | POA: Diagnosis present

## 2022-01-10 DIAGNOSIS — R29898 Other symptoms and signs involving the musculoskeletal system: Secondary | ICD-10-CM | POA: Diagnosis present

## 2022-01-10 NOTE — Therapy (Signed)
OUTPATIENT PHYSICAL THERAPY THORACOLUMBAR TREATMENT NOTE   Patient Name: Veronica Gonzales MRN: [id number] DOB:1970-04-10, 52 y.o., female Today's Date: 01/10/2022   PT End of Session - 01/10/22 0959     Visit Number 22    Number of Visits 30    Date for PT Re-Evaluation 01/24/22    Authorization Time Period 08/16/21 to 10/11/21; extended to 11/07/21; extended to Jan 16    PT Start Time 0953   pt arrived late to pool area   PT Stop Time 1031    PT Time Calculation (min) 38 min    Activity Tolerance Patient tolerated treatment well    Behavior During Therapy [facility] for tasks assessed/performed                 History reviewed. No pertinent past medical history. History reviewed. No pertinent surgical history. There are no problems to display for this patient.   PCP: does not currently have   REFERRING PROVIDER: Melina Schools, MD   REFERRING DIAG: M54.51 (ICD-10-CM) - Vertebrogenic low back pain   Rationale for Evaluation and Treatment Rehabilitation  THERAPY DIAG:  Other low back pain  Muscle weakness (generalized)  Other symptoms and signs involving the musculoskeletal system  ONSET DATE: 07/28/2021   SUBJECTIVE:                                                                                                                                                                                          SUBJECTIVE STATEMENT: Pt reports that she had flu/food poisoning and missed a few appts.  She reports she hasn't been to [facility] in 1.5 wks.  She reports she still has difficulty going up stairs, "I go up them like a cat".    PERTINENT HISTORY:  MD Summary: Garrie returns today for follow-up. Unfortunately 3 to 5 days after she left my office she contracted COVID and was unable to attend physical therapy. She states that she has done some self-directed exercises and is noted significant improvement.   At this point I have refilled her gabapentin as this is helping to relieve her  pain and we will start formalized aquatic physical therapy. If she fails to improve or there is worsening of her symptoms she knows to contact me I will be happy to see her back. Otherwise, she can follow-up with me on an as-needed basis.     PAIN:  Are you having pain? Yes: NPRS scale: 1-2/10, Pain location: lower back (bilat) into Lt posterior/lateral hip, numbness into Lt toes Pain description: OA pain going across back,  Aggravating factors: work, getting up in the morning  Relieving factors: other than  pool therapy, pain meds    PRECAUTIONS: None  WEIGHT BEARING RESTRICTIONS No  FALLS:  Has patient fallen in last 6 months? No  LIVING ENVIRONMENT: Lives with: lives alone Lives in: House/apartment Stairs: Yes: Internal: 16 steps; on right going up Has following equipment at home: None  OCCUPATION: nurse aide   PLOF: Independent, Independent with basic ADLs, Independent with gait, and Independent with transfers  PATIENT GOALS get pain down, be able to get upstairs easier, build endurance back up    OBJECTIVE: *findings taken at evaluation unless otherwise noted.   DIAGNOSTIC FINDINGS:    PATIENT SURVEYS: 12/13/21 Foto 18 with goal 53%  SCREENING FOR RED FLAGS: Bowel or bladder incontinence: No Spinal tumors: No Cauda equina syndrome: No Compression fracture: No Abdominal aneurysm: No  COGNITION:  Overall cognitive status: Within functional limits for tasks assessed     SENSATION: Not tested  MUSCLE LENGTH: Hamstrings WNL R, moderate limitation L  Piriformis WNL R, moderate limitation L  POSTURE: rounded shoulders, forward head, increased thoracic kyphosis, and flexed trunk   PALPATION: Lumbar and thoracic paraspinals tight but not TTP, no areas excessively tight or sore in B glutes or piriformis groups; 10/3- still very tight but not TTP in lumbar paraspinals   LUMBAR ROM:   Active  A/PROM  eval 10/11/21 12/13/21  Flexion Mild limitation, RFIS no change  in pain Mild limitation, mild HS tightness  WFL  Extension WNL, REIS improvement in pain  WNL, perhaps even a bit hypermobile  WFL  Right lateral flexion Moderate limitation  Moderate limitation  Min limitation  Left lateral flexion Moderate limitation  Severe limitation  Min limitation  Right rotation     Left rotation      (Blank rows = not tested)  **no pain with lumbar ROM 12/13/21  LOWER EXTREMITY MMT:    MMT Right eval Left eval 10/11/21 R 10/11/21 L  12/13/21 Right / Left  Hip flexion _0 4-   3+  Hip extension 3+ 3+ _1 / 3-  Hip abduction 4+ 4+ 4+ 4 5-  Hip adduction       Hip internal rotation       Hip external rotation       Knee flexion 4+ 4 4 4+ 5-  Knee extension 4+ 4+ 4+ 4+ 5-  Ankle dorsiflexion _2 Ankle plantarflexion       Ankle inversion       Ankle eversion        (Blank rows = not tested)  LUMBAR SPECIAL TESTS:    FUNCTIONAL TESTS: 12/13/21 30s STS 3.5 Berg:39/56  GAIT: Distance walked: in clinic distances  Assistive device utilized: None Level of assistance: Complete Independence Comments: antalgic, limited hip rotation, flexed at hips, holds trunk very rigid     TODAY'S TREATMENT  Pt seen for aquatic therapy today.  Treatment took place in water 3.25-25f 8" [facility] Temp of water was 92.  Pt entered/exited the pool via stairs independently with bilat rail.  * short warm up of walking unsupported forward/ backward , side stepping R/L * single yellow hand float at side : walking forward/ backward * Tricep push downs with yellow hand floats x 15, core engaged * Yellow hand floats at surface:  SLS and leg swings in sagittal plane and frontal plane (crossing midline) 2 sets of 10 each; SLS with arms moving in transverse plane with yellow hand floats  x 30s, * Forward L step ups, R retro step downs x 10 * reciprocal pattern up/down (forward) stairs (6 steps) x 3 sets with cues for slow, controlled  movement, with bilat UE on rail * straddling noodle and cycling, cc ski, jumping jack LE with UE on corner of wall  * hip hinge with arm reach with arms on yellow noodle x 10, slow speed, cues for glute/hamstring/TrA engagment                 Pt requires the buoyancy and hydrostatic pressure of water for support, and to offload joints by unweighting joint load by at least 50 % in navel deep water and by at least 75-80% in chest to neck deep water.  Viscosity of the water is needed for resistance of strengthening. Water current perturbations provides challenge to standing balance requiring increased core activation     PATIENT EDUCATION:  Education details: aquatics progression Person educated: Patient Education method: Customer service manager Education comprehension: verbalized understanding and returned demonstration   HOME EXERCISE PROGRAM: [id number]   ASSESSMENT:  CLINICAL IMPRESSION:  Pt has been away from therapy due to illness.  Session shortened due to pt's late arrival.  Continued work on core engagement while challenging balance and strengthening whole body when submerged ~70%.  Pt tolerated all exercises without increase in pain.   Pt making good progress towards remaining goals. Plan to create aquatic HEP for her to take with to [facility].  Will plan more balance exercises in water next visit.    OBJECTIVE IMPAIRMENTS Abnormal gait, difficulty walking, decreased ROM, decreased strength, hypomobility, increased fascial restrictions, increased muscle spasms, impaired flexibility, impaired sensation, improper body mechanics, postural dysfunction, obesity, and pain.   ACTIVITY LIMITATIONS carrying, lifting, bending, sitting, standing, squatting, stairs, transfers, locomotion level, and caring for others  PARTICIPATION LIMITATIONS: driving, shopping, community activity, occupation, and yard work  PERSONAL FACTORS Age, Fitness, Past/current experiences, Profession, and Time since  onset of injury/illness/exacerbation are also affecting patient's functional outcome.   REHAB POTENTIAL: Good  CLINICAL DECISION MAKING: Stable/uncomplicated  EVALUATION COMPLEXITY: Low   GOALS: Goals reviewed with patient? Yes  SHORT TERM GOALS: Target date: 09/13/2021  Will be compliant with appropriate progressive HEP  Baseline: Goal status: 10/3- ONGOING, doing some of HEP daily   2.  Pain to be no more than 4/10 at worst and sciatic pain with have improved by 50% in intensity  Baseline: Goal status: 10/3- ONGOING still gets to 9-10/10 at worst, sciatic pain has good and bad days. Pain depends on the day                       12/5: Achieved  3.  Will have better understanding of posture and biomechanics  Baseline:  Goal status: Achieved: 11/04/21  4.  Will be able to climb flight of step with U rail and step to pattern without increase in pain  Baseline:  Goal status: Achieved: 11/04/21    LONG TERM GOALS: Target date: 01/24/2021  MMT to be 5/5 globally  Baseline:  Goal status: IN PROGRESS improving  2.  Pain to be no more than 2/10 at worst with functional task performance  Baseline:  Goal status: 10/3- ONGOING can still get to 2/10  12/5-improving  3.  Will be able to ascend/descent full flight of steps with no rails and reciprocal pattern, no increase in pain  Baseline: intermittent use of rails Goal status: Partially met - 11/04/21  12/5: continues with intermittent use of rails  4.  Will demonstrate ability to perform mock/simulated patient transfers with good mechanics in order to assist in preventing injury at her job  Baseline:  Goal status: Achieved - 11/04/21  5. Pt will Improve Glute and hamstring strength up to 1 full grade to improve functional mobility  Baseline:3/3-  Goal Status: New  6. Pt will be indep with final aquatic and land based HEP to maintain gains made and have better management of chronic condition  Baseline: No  aquatic program  Goal status: New  7. Pt will improve on Berg Balance test to up or > 50 to demonstrate improved balance ability and decreased fall risk.  Baseline:39/56  Goal Status: New      PLAN: PT FREQUENCY: 1-2 x week  PT DURATION: 6 weeks  PLANNED INTERVENTIONS: Therapeutic exercises, Therapeutic activity, Neuromuscular re-education, Balance training, Gait training, Patient/Family education, Self Care, Joint mobilization, Stair training, DME instructions, Aquatic Therapy, Dry Needling, Electrical stimulation, Spinal mobilization, Cryotherapy, Moist heat, Taping, Traction, Ultrasound, Ionotophoresis 74m/ml Dexamethasone, Manual therapy, and Re-evaluation.  PLAN FOR NEXT SESSION:   posterior chain strengthening, balance retraining, core strengthening.  Continue d/c planning.   JKerin Perna PTA 01/10/22 1:16 PM [facility] [address]Phone: [phone]  Fax:  [phone]

## 2022-01-12 ENCOUNTER — Encounter (HOSPITAL_BASED_OUTPATIENT_CLINIC_OR_DEPARTMENT_OTHER): Payer: Self-pay | Admitting: Physical Therapy

## 2022-01-12 ENCOUNTER — Ambulatory Visit (HOSPITAL_BASED_OUTPATIENT_CLINIC_OR_DEPARTMENT_OTHER): Payer: Commercial Managed Care - HMO | Admitting: Physical Therapy

## 2022-01-12 DIAGNOSIS — M5459 Other low back pain: Secondary | ICD-10-CM

## 2022-01-12 DIAGNOSIS — R29898 Other symptoms and signs involving the musculoskeletal system: Secondary | ICD-10-CM

## 2022-01-12 DIAGNOSIS — M6281 Muscle weakness (generalized): Secondary | ICD-10-CM

## 2022-01-12 NOTE — Therapy (Signed)
OUTPATIENT PHYSICAL THERAPY THORACOLUMBAR TREATMENT NOTE   Patient Name: Veronica Gonzales MRN: [id number] DOB:07/05/1970, 52 y.o., female Today's Date: 01/12/2022   PT End of Session - 01/12/22 0952     Visit Number 23    Number of Visits 30    Date for PT Re-Evaluation 01/24/22    Authorization Time Period 08/16/21 to 10/11/21; extended to 11/07/21; extended to Jan 16    PT Start Time 0949    PT Stop Time 1029    PT Time Calculation (min) 40 min    Activity Tolerance Patient tolerated treatment well    Behavior During Therapy WFL for tasks assessed/performed                 History reviewed. No pertinent past medical history. History reviewed. No pertinent surgical history. There are no problems to display for this patient.   PCP: does not currently have   REFERRING PROVIDER: Brooks, Dahari, MD   REFERRING DIAG: M54.51 (ICD-10-CM) - Vertebrogenic low back pain   Rationale for Evaluation and Treatment Rehabilitation  THERAPY DIAG:  Other low back pain  Muscle weakness (generalized)  Other symptoms and signs involving the musculoskeletal system  ONSET DATE: 07/28/2021   SUBJECTIVE:                                                                                                                                                                                          SUBJECTIVE STATEMENT: Pt reports that she went to water aerobic class yesterday for 45 min, "I was sore afterwards".      PERTINENT HISTORY:  MD Summary: Veronica Gonzales returns today for follow-up. Unfortunately 3 to 5 days after she left my office she contracted COVID and was unable to attend physical therapy. She states that she has done some self-directed exercises and is noted significant improvement.   At this point I have refilled her gabapentin as this is helping to relieve her pain and we will start formalized aquatic physical therapy. If she fails to improve or there is worsening of her symptoms she  knows to contact me I will be happy to see her back. Otherwise, she can follow-up with me on an as-needed basis.     PAIN:  Are you having pain? 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 No  LIVING ENVIRONMENT:   Lives with: lives alone Lives in: House/apartment Stairs: Yes: Internal: 16 steps; on right going up Has following equipment at home: None  OCCUPATION: nurse aide   PLOF: Independent, Independent with basic ADLs, Independent with gait, and Independent with transfers  PATIENT GOALS get pain down, be able to get upstairs easier, build endurance back up    OBJECTIVE: *findings taken at evaluation unless otherwise noted.   DIAGNOSTIC FINDINGS:    PATIENT SURVEYS: 12/13/21 Foto 43 with goal 53%  SCREENING FOR RED FLAGS: Bowel or bladder incontinence: No Spinal tumors: No Cauda equina syndrome: No Compression fracture: No Abdominal aneurysm: No  COGNITION:  Overall cognitive status: Within functional limits for tasks assessed     SENSATION: Not tested  MUSCLE LENGTH: Hamstrings WNL R, moderate limitation L  Piriformis WNL R, moderate limitation L  POSTURE: rounded shoulders, forward head, increased thoracic kyphosis, and flexed trunk   PALPATION: Lumbar and thoracic paraspinals tight but not TTP, no areas excessively tight or sore in B glutes or piriformis groups; 10/3- still very tight but not TTP in lumbar paraspinals   LUMBAR ROM:   Active  A/PROM  eval 10/11/21 12/13/21  Flexion Mild limitation, RFIS no change in pain Mild limitation, mild HS tightness  WFL  Extension WNL, REIS improvement in pain  WNL, perhaps even a bit hypermobile   WFL  Right lateral flexion Moderate limitation  Moderate limitation  Min limitation  Left lateral flexion Moderate limitation  Severe limitation  Min limitation  Right rotation     Left rotation      (Blank rows = not tested)  **no pain with lumbar ROM 12/13/21  LOWER EXTREMITY MMT:    MMT Right eval Left eval 10/11/21 R 10/11/21 L  12/13/21 Right / Left  Hip flexion 4 4 3 3 4-   3+  Hip extension 3+ 3+ 3 3 3 / 3-  Hip abduction 4+ 4+ 4+ 4 5-  Hip adduction       Hip internal rotation       Hip external rotation       Knee flexion 4+ 4 4 4+ 5-  Knee extension 4+ 4+ 4+ 4+ 5-  Ankle dorsiflexion 5 5 5 5 5  Ankle plantarflexion       Ankle inversion       Ankle eversion        (Blank rows = not tested)  LUMBAR SPECIAL TESTS:    FUNCTIONAL TESTS: 12/13/21 30s STS 3.5 Berg:39/56 BERG Balance Test          Date: 12/13/21  Sit to Stand 2  Standing unsupported 4  Sitting with back unsupported but feet supported 4  Stand to sit  3  Transfers  3  Standing unsupported with eyes closed 4  Standing unsupported feet together 4  From standing position, reach forward with outstretched arm 3  From standing position, pick up object from floor 3  From standing position, turn and look behind over each shoulder 3  Turn 360 2  Standing unsupported, alternately place foot on step 2  Standing unsupported, one foot in front 0  Standing on one leg 2  Total:  39     GAIT: Distance walked: in clinic distances  Assistive device utilized: None Level of assistance: Complete Independence Comments: antalgic, limited hip rotation, flexed at hips, holds trunk very rigid     TODAY'S TREATMENT  Pt seen for aquatic therapy today.  Treatment took place in water 3.25-4ft 8" in depth   at the [facility] pool. Temp of water was 92.  Pt entered/exited the pool via stairs independently with bilat rail.  * single yellow hand float at side, then bilat: walking forward/ backward * side  stepping with arm abdct/ add with yellow hand floats x 4 laps (all out/all together and "A/ T" position)  * short warm up of walking unsupported forward/ backward , side stepping R/L * Tricep push downs with yellow hand floats x 10, core engaged * SLS with bilat shoulder horiz abdct/adddct * Tandem stance with hands on shoulders x 20 sec  * plank with hands on bench with hip ext, (cues to limit height of LE) * L stretch at wall x 3 reps  * STS at bench with blue step under feet and 4 count eccentric lowering down x 6 * Quad stretch with foot on bench x 20s on RLE x 2, on LLE x 1 * return to walking forward/ backward and light jog forward / backward  * straddling noodle and cycling with breast stroke arms; cc ski, jumping jack LE with UE on corner of wall      Pt requires the buoyancy and hydrostatic pressure of water for support, and to offload joints by unweighting joint load by at least 50 % in navel deep water and by at least 75-80% in chest to neck deep water.  Viscosity of the water is needed for resistance of strengthening. Water current perturbations provides challenge to standing balance requiring increased core activation     PATIENT EDUCATION:  Education details: aquatics progression Person educated: Patient Education method: Customer service manager Education comprehension: verbalized understanding and returned demonstration   HOME EXERCISE PROGRAM: [id number]   ASSESSMENT:  CLINICAL IMPRESSION:  Continued work on core engagement and hip/back strengthening.  Cues to adjust resistance according to how she is feeling as to not over do it.  Pt tolerated all exercises without increase in pain.   Pt making good progress towards remaining goals. Plan to create aquatic HEP for her to take with to [facility].  Will plan more balance exercises in water next visit.    OBJECTIVE IMPAIRMENTS Abnormal gait, difficulty walking, decreased ROM, decreased strength, hypomobility, increased  fascial restrictions, increased muscle spasms, impaired flexibility, impaired sensation, improper body mechanics, postural dysfunction, obesity, and pain.   ACTIVITY LIMITATIONS carrying, lifting, bending, sitting, standing, squatting, stairs, transfers, locomotion level, and caring for others  PARTICIPATION LIMITATIONS: driving, shopping, community activity, occupation, and yard work  PERSONAL FACTORS Age, Fitness, Past/current experiences, Profession, and Time since onset of injury/illness/exacerbation are also affecting patient's functional outcome.   REHAB POTENTIAL: Good  CLINICAL DECISION MAKING: Stable/uncomplicated  EVALUATION COMPLEXITY: Low   GOALS: Goals reviewed with patient? Yes  SHORT TERM GOALS: Target date: 09/13/2021  Will be compliant with appropriate progressive HEP  Baseline: Goal status: 10/3- ONGOING, doing some of HEP daily   2.  Pain to be no more than 4/10 at worst and sciatic pain with have improved by 50% in intensity  Baseline: Goal status: 10/3- ONGOING still gets to 9-10/10 at worst, sciatic pain has good and bad days. Pain depends on the day                       12/5: Achieved  3.  Will have better understanding of posture and biomechanics  Baseline:  Goal status: Achieved: 11/04/21  4.  Will be able to climb flight of step with U rail and step to pattern without  increase in pain  Baseline:  Goal status: Achieved: 11/04/21    LONG TERM GOALS: Target date: 01/24/2021  MMT to be 5/5 globally  Baseline:  Goal status: IN PROGRESS improving  2.  Pain to be no more than 2/10 at worst with functional task performance  Baseline:  Goal status: 10/3- ONGOING can still get to 2/10  12/5-improving  3.  Will be able to ascend/descent full flight of steps with no rails and reciprocal pattern, no increase in pain  Baseline: intermittent use of rails Goal status: Partially met - 11/04/21                     12/5: continues with intermittent use of  rails  4.  Will demonstrate ability to perform mock/simulated patient transfers with good mechanics in order to assist in preventing injury at her job  Baseline:  Goal status: Achieved - 11/04/21  5. Pt will Improve Glute and hamstring strength up to 1 full grade to improve functional mobility  Baseline:3/3-  Goal Status: New  6. Pt will be indep with final aquatic and land based HEP to maintain gains made and have better management of chronic condition  Baseline: No aquatic program  Goal status: New  7. Pt will improve on Berg Balance test to up or > 50 to demonstrate improved balance ability and decreased fall risk.  Baseline:39/56  Goal Status: New      PLAN: PT FREQUENCY: 1-2 x week  PT DURATION: 6 weeks  PLANNED INTERVENTIONS: Therapeutic exercises, Therapeutic activity, Neuromuscular re-education, Balance training, Gait training, Patient/Family education, Self Care, Joint mobilization, Stair training, DME instructions, Aquatic Therapy, Dry Needling, Electrical stimulation, Spinal mobilization, Cryotherapy, Moist heat, Taping, Traction, Ultrasound, Ionotophoresis 4mg/ml Dexamethasone, Manual therapy, and Re-evaluation.  PLAN FOR NEXT SESSION:   posterior chain strengthening, balance retraining, core strengthening.  Continue d/c planning.   Jennifer Carlson-Long, PTA 01/12/22 10:26 AM [facility] [address] [place], [place], [address] Phone: [phone]   Fax:  [phone]  

## 2022-01-13 ENCOUNTER — Other Ambulatory Visit (HOSPITAL_COMMUNITY)
Admission: RE | Admit: 2022-01-13 | Discharge: 2022-01-13 | Disposition: A | Discharge status: Home / Self Care | Payer: Commercial Managed Care - HMO | Source: Ambulatory Visit | Attending: Obstetrics & Gynecology | Admitting: Obstetrics & Gynecology

## 2022-01-13 ENCOUNTER — Encounter (HOSPITAL_BASED_OUTPATIENT_CLINIC_OR_DEPARTMENT_OTHER): Payer: Self-pay | Admitting: Obstetrics & Gynecology

## 2022-01-13 ENCOUNTER — Ambulatory Visit (INDEPENDENT_AMBULATORY_CARE_PROVIDER_SITE_OTHER): Payer: Commercial Managed Care - HMO | Admitting: Obstetrics & Gynecology

## 2022-01-13 VITALS — BP 150/98 | HR 84 | Ht 60.0 in | Wt 236.8 lb

## 2022-01-13 DIAGNOSIS — I10 Essential (primary) hypertension: Secondary | ICD-10-CM

## 2022-01-13 DIAGNOSIS — L509 Urticaria, unspecified: Secondary | ICD-10-CM | POA: Insufficient documentation

## 2022-01-13 DIAGNOSIS — R222 Localized swelling, mass and lump, trunk: Secondary | ICD-10-CM

## 2022-01-13 DIAGNOSIS — Z1231 Encounter for screening mammogram for malignant neoplasm of breast: Secondary | ICD-10-CM

## 2022-01-13 DIAGNOSIS — Z124 Encounter for screening for malignant neoplasm of cervix: Secondary | ICD-10-CM

## 2022-01-13 DIAGNOSIS — N926 Irregular menstruation, unspecified: Secondary | ICD-10-CM

## 2022-01-13 DIAGNOSIS — R350 Frequency of micturition: Secondary | ICD-10-CM

## 2022-01-13 DIAGNOSIS — Z01419 Encounter for gynecological examination (general) (routine) without abnormal findings: Secondary | ICD-10-CM | POA: Diagnosis not present

## 2022-01-13 DIAGNOSIS — Z113 Encounter for screening for infections with a predominantly sexual mode of transmission: Secondary | ICD-10-CM

## 2022-01-13 DIAGNOSIS — Z6841 Body Mass Index (BMI) 40.0 and over, adult: Secondary | ICD-10-CM

## 2022-01-13 LAB — POCT URINALYSIS DIPSTICK
Appearance: NORMAL
Bilirubin, UA: NEGATIVE
Glucose, UA: NEGATIVE
Ketones, UA: NEGATIVE
Leukocytes, UA: NEGATIVE
Nitrite, UA: NEGATIVE
Odor: NORMAL
Protein, UA: NEGATIVE
Spec Grav, UA: 1.02 (ref 1.010–1.025)
Urobilinogen, UA: 0.2 E.U./dL
pH, UA: 8 (ref 5.0–8.0)

## 2022-01-13 NOTE — Progress Notes (Unsigned)
52 y.o. [id number] Single Black or [place] American female here for annual exam/new patient exam.  Reports bleeding is very irregular.  Last cycle was 12/13/2021.  She reports cycles are very irregular.  Flow can be three days to months.  There is no regularity to when she will start her cycles.  Color seems to have changed.  Flow can be heavy.    Cardiologist:  [facility], Dr. Craige Cotta.  He has recently ordered lab work.  She has not done this.  Has a lot of urinary urgency.  Sometimes does leak.  Leaks with cough, laugh, sneeze, lifting but sometimes has urgency.  Denies dysuria.    Patient's last menstrual period was 12/13/2021.          Sexually active: Yes.    The current method of family planning is withdrawal  Smoker:  no, quit in 2007  Health Maintenance: Pap:  long time age History of abnormal Pap:  no MMG:  not done recently Colonoscopy:  needs referral Screening Labs: ordered   reports that she quit smoking about 17 years ago. Her smoking use included cigarettes. She has never used smokeless tobacco. She reports current alcohol use. She reports that she does not use drugs.  Past Medical History:  Diagnosis Date   Hypertension    Sciatic nerve pain     Past Surgical History:  Procedure Laterality Date   CARPAL TUNNEL RELEASE     CESAREAN SECTION      Current Outpatient Medications  Medication Sig Dispense Refill   amLODipine (NORVASC) 10 MG tablet Take 10 mg by mouth daily.     cloNIDine (CATAPRES) 0.1 MG tablet Take 0.1 mg by mouth 2 (two) times daily.     gabapentin (NEURONTIN) 300 MG capsule Take 300 mg by mouth 2 (two) times daily.     meloxicam (MOBIC) 15 MG tablet Take 15 mg by mouth as needed.     traMADol (ULTRAM) 50 MG tablet Take 50 mg by mouth every 12 (twelve) hours as needed.     No current facility-administered medications for this visit.    Family History  Problem Relation Age of Onset   Cancer Mother    Cancer Father     ROS: Constitutional:  negative Genitourinary:negative  Exam:   BP (!) 150/98 (BP Location: Right Arm, Patient Position: Sitting, Cuff Size: Large)   Pulse 84   Ht 5' (1.524 m)   Wt 236 lb 12.8 oz (107.4 kg)   LMP 12/13/2021   BMI 46.25 kg/m   Height: 5' (152.4 cm)  General appearance: alert, cooperative and appears stated age Head: Normocephalic, without obvious abnormality, atraumatic Neck: no adenopathy, supple, symmetrical, trachea midline and thyroid normal to inspection and palpation Lungs: clear to auscultation bilaterally Breasts: normal appearance, no masses or tenderness, above breast on the left is a 6cm soft mass c/w lipoma Heart: regular rate and rhythm Abdomen: soft, non-tender; bowel sounds normal; no masses,  no organomegaly Extremities: extremities normal, atraumatic, no cyanosis or edema Skin: Skin color, texture, turgor normal. No rashes or lesions Lymph nodes: Cervical, supraclavicular, and axillary nodes normal. No abnormal inguinal nodes palpated Neurologic: Grossly normal   Pelvic: External genitalia:  no lesions              Urethra:  normal appearing urethra with no masses, tenderness or lesions              Bartholins and Skenes: normal  Vagina: normal appearing vagina with normal color and no discharge, no lesions              Cervix: no lesions              Pap taken: Yes.   Bimanual Exam:  Uterus:  normal size, contour, position, consistency, mobility, non-tender              Adnexa: normal adnexa and no mass, fullness, tenderness               Rectovaginal: Confirms               Anus:  normal sphincter tone, no lesions  Chaperone, KimAlexis Bonham, CMA, was present for exam.  Assessment/Plan: 1. Well woman exam with routine gynecological exam - Pap smear and HR HPV obtained today - Mammogram order placed - Colonoscopy referral will be placed - Bone mineral density not due yet - lab work placed per orders below - vaccines reviewed/updated  2.  Urinary frequency - POCT Urinalysis Dipstick - TSH  3. Cervical cancer screening - Cytology - PAP( [place])  4. Primary hypertension - Comprehensive metabolic panel - Hemoglobin A1c - Lipid panel  5. BMI 45.0-49.9, adult (HCC) - Comprehensive metabolic panel - Hemoglobin A1c - Lipid panel  6. Irregular bleeding - CBC - TSH - Iron, TIBC and Ferritin Panel  7. Encounter for screening mammogram for malignant neoplasm of breast - MM 3D SCREEN BREAST BILATERAL; Future  8. Chest wall mass (c/w lipoma) - [place] CHEST SOFT TISSUE; Future

## 2022-01-14 LAB — COMPREHENSIVE METABOLIC PANEL
ALT: 13 IU/L (ref 0–32)
AST: 13 IU/L (ref 0–40)
Albumin/Globulin Ratio: 1.5 (ref 1.2–2.2)
Albumin: 4.5 g/dL (ref 3.8–4.9)
Alkaline Phosphatase: 93 IU/L (ref 44–121)
BUN/Creatinine Ratio: 13 (ref 9–23)
BUN: 8 mg/dL (ref 6–24)
Bilirubin Total: 0.3 mg/dL (ref 0.0–1.2)
CO2: 25 mmol/L (ref 20–29)
Calcium: 9.8 mg/dL (ref 8.7–10.2)
Chloride: 97 mmol/L (ref 96–106)
Creatinine, Ser: 0.63 mg/dL (ref 0.57–1.00)
Globulin, Total: 3.1 g/dL (ref 1.5–4.5)
Glucose: 105 mg/dL — ABNORMAL HIGH (ref 70–99)
Potassium: 3.6 mmol/L (ref 3.5–5.2)
Sodium: 138 mmol/L (ref 134–144)
Total Protein: 7.6 g/dL (ref 6.0–8.5)
eGFR: 106 mL/min/{1.73_m2} (ref >59)

## 2022-01-14 LAB — LIPID PANEL
Chol/HDL Ratio: 5.4 ratio — ABNORMAL HIGH (ref 0.0–4.4)
Cholesterol, Total: 282 mg/dL — ABNORMAL HIGH (ref 100–199)
HDL: 52 mg/dL (ref >39)
LDL Chol Calc (NIH): 197 mg/dL — ABNORMAL HIGH (ref 0–99)
Triglycerides: 177 mg/dL — ABNORMAL HIGH (ref 0–149)
VLDL Cholesterol Cal: 33 mg/dL (ref 5–40)

## 2022-01-14 LAB — TSH: TSH: 1.82 u[IU]/mL (ref 0.450–4.500)

## 2022-01-14 LAB — CBC
Hematocrit: 43.5 % (ref 34.0–46.6)
Hemoglobin: 13.6 g/dL (ref 11.1–15.9)
MCH: 26.9 pg (ref 26.6–33.0)
MCHC: 31.3 g/dL — ABNORMAL LOW (ref 31.5–35.7)
MCV: 86 fL (ref 79–97)
Platelets: 234 10*3/uL (ref 150–450)
RBC: 5.06 x10E6/uL (ref 3.77–5.28)
RDW: 13.7 % (ref 11.7–15.4)
WBC: 6.1 10*3/uL (ref 3.4–10.8)

## 2022-01-14 LAB — HEMOGLOBIN A1C
Est. average glucose Bld gHb Est-mCnc: 128 mg/dL
Hgb A1c MFr Bld: 6.1 % — ABNORMAL HIGH (ref 4.8–5.6)

## 2022-01-14 LAB — IRON,TIBC AND FERRITIN PANEL
Ferritin: 323 ng/mL — ABNORMAL HIGH (ref 15–150)
Iron Saturation: 24 % (ref 15–55)
Iron: 68 ug/dL (ref 27–159)
Total Iron Binding Capacity: 288 ug/dL (ref 250–450)
UIBC: 220 ug/dL (ref 131–425)

## 2022-01-17 ENCOUNTER — Encounter (HOSPITAL_BASED_OUTPATIENT_CLINIC_OR_DEPARTMENT_OTHER): Payer: Self-pay | Admitting: Physical Therapy

## 2022-01-17 ENCOUNTER — Ambulatory Visit (HOSPITAL_BASED_OUTPATIENT_CLINIC_OR_DEPARTMENT_OTHER): Payer: Commercial Managed Care - HMO | Admitting: Physical Therapy

## 2022-01-17 DIAGNOSIS — R29898 Other symptoms and signs involving the musculoskeletal system: Secondary | ICD-10-CM

## 2022-01-17 DIAGNOSIS — M5459 Other low back pain: Secondary | ICD-10-CM

## 2022-01-17 DIAGNOSIS — M6281 Muscle weakness (generalized): Secondary | ICD-10-CM

## 2022-01-17 LAB — CYTOLOGY - PAP
Comment: NEGATIVE
Diagnosis: NEGATIVE
High risk HPV: NEGATIVE

## 2022-01-17 NOTE — Therapy (Addendum)
OUTPATIENT PHYSICAL THERAPY THORACOLUMBAR DC  PHYSICAL THERAPY DISCHARGE SUMMARY  Visits from Start of Care: 24  Current functional level related to goals / functional outcomes: Pt is indep with adl's and all functional mobility.   Remaining deficits: Residual pain   Education / Equipment: Management of condition/ HEP   Patient agrees to discharge. Patient goals were partially met. Patient is being discharged due to maximized rehab potential.     Patient Name: Veronica Gonzales MRN: [id number] DOB:[birth year], 52 y.o., female Today's Date: 01/17/2022   PT End of Session - 01/17/22 1341     Visit Number 24    Number of Visits 30    Date for PT Re-Evaluation 01/24/22    Authorization Time Period 08/16/21 to 10/11/21; extended to 11/07/21; extended to Jan 16    PT Start Time 0955   pt late for appt   PT Stop Time 1033    PT Time Calculation (min) 38 min    Activity Tolerance Patient tolerated treatment well    Behavior During Therapy [facility] for tasks assessed/performed                 Past Medical History:  Diagnosis Date   Hypertension    Sciatic nerve pain    Past Surgical History:  Procedure Laterality Date   CARPAL TUNNEL RELEASE     CESAREAN SECTION     Patient Active Problem List   Diagnosis Date Noted   Urticaria 01/13/2022   Lumbar radiculopathy 04/13/2021    PCP: does not currently have   REFERRING PROVIDER: Melina Schools, MD   REFERRING DIAG: M54.51 (ICD-10-CM) - Vertebrogenic low back pain   Rationale for Evaluation and Treatment Rehabilitation  THERAPY DIAG:  Other low back pain  Muscle weakness (generalized)  Other symptoms and signs involving the musculoskeletal system  ONSET DATE: 07/28/2021   SUBJECTIVE:                                                                                                                                                                                          SUBJECTIVE STATEMENT: Pt reports she went to  [employer] yesterday and used the elliptical, recumbent bike and treatmill.  Treadmill caused tingling across LB and stopped      PERTINENT HISTORY:  MD Summary: Veronica Gonzales returns today for follow-up. Unfortunately 3 to 5 days after she left my office she contracted COVID and was unable to attend physical therapy. She states that she has done some self-directed exercises and is noted significant improvement.   At this point I have refilled her gabapentin as this is helping to relieve her pain and we will  start formalized aquatic physical therapy. If she fails to improve or there is worsening of her symptoms she knows to contact me I will be happy to see her back. Otherwise, she can follow-up with me on an as-needed basis.     PAIN:  Are you having pain? 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 No  LIVING ENVIRONMENT: Lives with: lives alone Lives in: House/apartment Stairs: Yes: Internal: 16 steps; on right going up Has following equipment at home: None  OCCUPATION: nurse aide   PLOF: Independent, Independent with basic ADLs, Independent with gait, and Independent with transfers  PATIENT GOALS get pain down, be able to get upstairs easier, build endurance back up    OBJECTIVE: *findings taken at evaluation unless otherwise noted.   DIAGNOSTIC FINDINGS:    PATIENT SURVEYS: 12/13/21 Foto 43 with goal 53%  SCREENING FOR RED FLAGS: Bowel or bladder incontinence: No Spinal tumors: No Cauda equina syndrome: No Compression fracture: No Abdominal aneurysm: No  COGNITION:  Overall cognitive status: Within functional limits for tasks  assessed     SENSATION: Not tested  MUSCLE LENGTH: Hamstrings WNL R, moderate limitation L  Piriformis WNL R, moderate limitation L  POSTURE: rounded shoulders, forward head, increased thoracic kyphosis, and flexed trunk   PALPATION: Lumbar and thoracic paraspinals tight but not TTP, no areas excessively tight or sore in B glutes or piriformis groups; 10/3- still very tight but not TTP in lumbar paraspinals   LUMBAR ROM:   Active  A/PROM  eval 10/11/21 12/13/21  Flexion Mild limitation, RFIS no change in pain Mild limitation, mild HS tightness  WFL  Extension WNL, REIS improvement in pain  WNL, perhaps even a bit hypermobile  WFL  Right lateral flexion Moderate limitation  Moderate limitation  Min limitation  Left lateral flexion Moderate limitation  Severe limitation  Min limitation  Right rotation     Left rotation      (Blank rows = not tested)  **no pain with lumbar ROM 12/13/21  LOWER EXTREMITY MMT:    MMT Right eval Left eval 10/11/21 R 10/11/21 L  12/13/21 Right / Left  Hip flexion 4 4 3 3  4-   3+  Hip extension 3+ 3+ 3 3 3  / 3-  Hip abduction 4+ 4+ 4+ 4 5-  Hip adduction       Hip internal rotation       Hip external rotation       Knee flexion 4+ 4 4 4+ 5-  Knee extension 4+ 4+ 4+ 4+ 5-  Ankle dorsiflexion 5 5 5 5 5   Ankle plantarflexion       Ankle inversion       Ankle eversion        (Blank rows = not tested)  LUMBAR SPECIAL TESTS:    FUNCTIONAL TESTS: 12/13/21 30s STS 3.5 Berg:39/56 BERG Balance Test          Date: 12/13/21  Sit to Stand 2  Standing unsupported 4  Sitting with back unsupported but feet supported 4  Stand to sit  3  Transfers  3  Standing unsupported with eyes closed 4  Standing unsupported feet together 4  From  standing position, reach forward with outstretched arm 3  From standing position, pick up object from floor 3  From standing position, turn and look behind over each shoulder 3  Turn 360 2  Standing unsupported,  alternately place foot on step 2  Standing unsupported, one foot in front 0  Standing on one leg 2  Total:  39     GAIT: Distance walked: in clinic distances  Assistive device utilized: None Level of assistance: Complete Independence Comments: antalgic, limited hip rotation, flexed at hips, holds trunk very rigid     TODAY'S TREATMENT  Pt seen for aquatic therapy today.  Treatment took place in water 3.25-58ft 8" in depth at the [facility] pool. Temp of water was 92.  Pt entered/exited the pool via stairs independently with bilat rail.  * walking unsupported forward/ backward , side stepping R/L * single yellow hand float at side, then bilat: walking forward/ backward * side stepping with arm abdct/ add with yellow hand floats x 4 laps  * SLS; SLS with bilat shoulder horiz abdct/adddct with vision then VE   *core engagement: hand bells used with oscillation in frontal and sagittal planes 2 sets in ea position  feet staggard then wide stance * L stretch at wall x 3 reps  * return to walking forward/ backward and light jog forward / backward  * straddling noodle and cycling with breast stroke arms     Pt requires the buoyancy and hydrostatic pressure of water for support, and to offload joints by unweighting joint load by at least 50 % in navel deep water and by at least 75-80% in chest to neck deep water.  Viscosity of the water is needed for resistance of strengthening. Water current perturbations provides challenge to standing balance requiring increased core activation     PATIENT EDUCATION:  Education details: aquatics progression Person educated: Patient Education method: Medical illustrator Education comprehension: verbalized understanding and returned demonstration   HOME EXERCISE PROGRAM: Access Code: [email or web address] URL: [email or web address] Date: 01/17/2022 Prepared by: Geni Bers  Exercises - Kettlebell Suitcase Carry  - 1 x  daily - 7 x weekly - 3 sets - 10 reps - Side Stepping with Hand Floats  - 1 x daily - 7 x weekly - 3 sets - 10 reps - Single Leg Stance at Pool Wall  - 1 x daily - 7 x weekly - 3 sets - 10 reps - Seated Straddle on Flotation Forward Breast Stroke Arms and Bicycle Legs  - 1 x daily - 7 x weekly - 3 sets - 10 reps  ASSESSMENT:  CLINICAL IMPRESSION:  Pt has progressed her own indep exercises to going to gym and water classes at the [facility].  Her pain in LB is much improved 1/10 and she verbalizes understanding of management of her LBP which seems to be becoming chronic.  She tolerates increased intensity of core strengthening with reports of just tiredness in right LB area. Began updated and final HEP. Few goals met. She will be ready for DC at and of cert.    OBJECTIVE IMPAIRMENTS Abnormal gait, difficulty walking, decreased ROM, decreased strength, hypomobility, increased fascial restrictions, increased muscle spasms, impaired flexibility, impaired sensation, improper body mechanics, postural dysfunction, obesity, and pain.   ACTIVITY LIMITATIONS carrying, lifting, bending, sitting, standing, squatting, stairs, transfers, locomotion level, and caring for others  PARTICIPATION LIMITATIONS: driving, shopping, community activity, occupation, and yard work  PERSONAL FACTORS Age, Fitness, Past/current experiences, Profession, and Time since onset of injury/illness/exacerbation  are also affecting patient's functional outcome.   REHAB POTENTIAL: Good  CLINICAL DECISION MAKING: Stable/uncomplicated  EVALUATION COMPLEXITY: Low   GOALS: Goals reviewed with patient? Yes  SHORT TERM GOALS: Target date: 09/13/2021  Will be compliant with appropriate progressive HEP  Baseline: Goal status: 10/3- ONGOING, doing some of HEP daily . Met 01/17/22 2.  Pain to be no more than 4/10 at worst and sciatic pain with have improved by 50% in intensity  Baseline: Goal status: 10/3- ONGOING still gets to 9-10/10 at  worst, sciatic pain has good and bad days. Pain depends on the day                       12/5: Achieved  3.  Will have better understanding of posture and biomechanics  Baseline:  Goal status: Achieved: 11/04/21  4.  Will be able to climb flight of step with U rail and step to pattern without increase in pain  Baseline:  Goal status: Achieved: 11/04/21    LONG TERM GOALS: Target date: 01/24/2021  MMT to be 5/5 globally  Baseline:  Goal status: IN PROGRESS improving. Untested as pt cancelled her last appointment  2.  Pain to be no more than 2/10 at worst with functional task performance  Baseline:  Goal status: 10/3- ONGOING can still get to 2/10  12/5-improving Met 01/17/22  3.  Will be able to ascend/descent full flight of steps with no rails and reciprocal pattern, no increase in pain  Baseline: intermittent use of rails Goal status: Partially met - 11/04/21                     12/5: continues with intermittent use of rails  4.  Will demonstrate ability to perform mock/simulated patient transfers with good mechanics in order to assist in preventing injury at her job  Baseline:  Goal status: Achieved - 11/04/21  5. Pt will Improve Glute and hamstring strength up to 1 full grade to improve functional mobility  Baseline:3/3-  Goal Status: untested as pt  cancelled last appointment  6. Pt will be indep with final aquatic and land based HEP to maintain gains made and have better management of chronic condition  Baseline: No aquatic program  Goal status: Met  7. Pt will improve on Berg Balance test to up or > 50 to demonstrate improved balance ability and decreased fall risk.  Baseline:39/56  Goal Status:  untested as pt  cancelled last appointment      PLAN: PT FREQUENCY: 1-2 x week  PT DURATION: 6 weeks  PLANNED INTERVENTIONS: Therapeutic exercises, Therapeutic activity, Neuromuscular re-education, Balance training, Gait training, Patient/Family education, Self Care,  Joint mobilization, Stair training, DME instructions, Aquatic Therapy, Dry Needling, Electrical stimulation, Spinal mobilization, Cryotherapy, Moist heat, Taping, Traction, Ultrasound, Ionotophoresis 4mg /ml Dexamethasone, Manual therapy, and Re-evaluation.  PLAN FOR NEXT SESSION:   posterior chain strengthening, balance retraining, core strengthening.  Continue d/c planning.   Stanton Kidney [place]) Kameran Lallier MPT 01/17/22 [facility] [address] [place], [place], [address] Phone: [phone]   Fax:  [phone]  Addend:  Annamarie Major) Aidan Caloca MPT 01/24/22 144pm

## 2022-01-18 ENCOUNTER — Encounter (HOSPITAL_BASED_OUTPATIENT_CLINIC_OR_DEPARTMENT_OTHER): Payer: Self-pay

## 2022-01-18 DIAGNOSIS — G4709 Other insomnia: Secondary | ICD-10-CM

## 2022-01-18 DIAGNOSIS — G4733 Obstructive sleep apnea (adult) (pediatric): Secondary | ICD-10-CM

## 2022-01-19 ENCOUNTER — Ambulatory Visit (HOSPITAL_BASED_OUTPATIENT_CLINIC_OR_DEPARTMENT_OTHER): Payer: Commercial Managed Care - HMO | Admitting: Physical Therapy

## 2022-01-19 ENCOUNTER — Encounter (HOSPITAL_BASED_OUTPATIENT_CLINIC_OR_DEPARTMENT_OTHER): Payer: Self-pay

## 2022-01-23 NOTE — Therapy (Incomplete)
OUTPATIENT PHYSICAL THERAPY THORACOLUMBAR   PHYSICAL THERAPY DISCHARGE SUMMARY  Visits from Start of Care: 25  Current functional level related to goals / functional outcomes: Pt is safe and indep with all AL's and functional mobility   Remaining deficits: ***   Education / Equipment: Management of condition/ HEP   Patient agrees to discharge. Patient goals were {OP Goals:25702::"met"}. Patient is being discharged due to {OP Discharge Reasons:25703::"meeting the stated rehab goals."}   Patient Name: Veronica Gonzales MRN: [id number] DOB:1970/06/10, 52 y.o., female Today's Date: 01/17/2022   PT End of Session - 01/17/22 1341     Visit Number 24    Number of Visits 30    Date for PT Re-Evaluation 01/24/22    Authorization Time Period 08/16/21 to 10/11/21; extended to 11/07/21; extended to Jan 16    PT Start Time 0955   pt late for appt   PT Stop Time 1033    PT Time Calculation (min) 38 min    Activity Tolerance Patient tolerated treatment well    Behavior During Therapy [facility] for tasks assessed/performed                 Past Medical History:  Diagnosis Date   Hypertension    Sciatic nerve pain    Past Surgical History:  Procedure Laterality Date   CARPAL TUNNEL RELEASE     CESAREAN SECTION     Patient Active Problem List   Diagnosis Date Noted   Urticaria 01/13/2022   Lumbar radiculopathy 04/13/2021    PCP: does not currently have   REFERRING PROVIDER: Venita Lick, MD   REFERRING DIAG: M54.51 (ICD-10-CM) - Vertebrogenic low back pain   Rationale for Evaluation and Treatment Rehabilitation  THERAPY DIAG:  Other low back pain  Muscle weakness (generalized)  Other symptoms and signs involving the musculoskeletal system  ONSET DATE: 07/28/2021   SUBJECTIVE:                                                                                                                                                                                          SUBJECTIVE  STATEMENT: Pt reports she went to [employer] yesterday and used the elliptical, recumbent bike and treatmill.  Treadmill caused tingling across LB and stopped      PERTINENT HISTORY:  MD Summary: Vance returns today for follow-up. Unfortunately 3 to 5 days after she left my office she contracted COVID and was unable to attend physical therapy. She states that she has done some self-directed exercises and is noted significant improvement.   At this point I have refilled her gabapentin as this is helping to relieve her pain  and we will start formalized aquatic physical therapy. If she fails to improve or there is worsening of her symptoms she knows to contact me I will be happy to see her back. Otherwise, she can follow-up with me on an as-needed basis.     PAIN:  Are you having pain? 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 No  LIVING ENVIRONMENT: Lives with: lives alone Lives in: House/apartment Stairs: Yes: Internal: 16 steps; on right going up Has following equipment at home: None  OCCUPATION: nurse aide   PLOF: Independent, Independent with basic ADLs, Independent with gait, and Independent with transfers  PATIENT GOALS get pain down, be able to get upstairs easier, build endurance back up    OBJECTIVE: *findings taken at evaluation unless otherwise noted.   DIAGNOSTIC FINDINGS:    PATIENT SURVEYS: 12/13/21 Foto 43 with goal 53%  SCREENING FOR RED FLAGS: Bowel or bladder incontinence: No Spinal tumors: No Cauda equina syndrome: No Compression fracture: No Abdominal aneurysm: No  COGNITION:  Overall cognitive status: Within functional limits  for tasks assessed     SENSATION: Not tested  MUSCLE LENGTH: Hamstrings WNL R, moderate limitation L  Piriformis WNL R, moderate limitation L  POSTURE: rounded shoulders, forward head, increased thoracic kyphosis, and flexed trunk   PALPATION: Lumbar and thoracic paraspinals tight but not TTP, no areas excessively tight or sore in B glutes or piriformis groups; 10/3- still very tight but not TTP in lumbar paraspinals   LUMBAR ROM:   Active  A/PROM  eval 10/11/21 12/13/21  Flexion Mild limitation, RFIS no change in pain Mild limitation, mild HS tightness  WFL  Extension WNL, REIS improvement in pain  WNL, perhaps even a bit hypermobile  WFL  Right lateral flexion Moderate limitation  Moderate limitation  Min limitation  Left lateral flexion Moderate limitation  Severe limitation  Min limitation  Right rotation     Left rotation      (Blank rows = not tested)  **no pain with lumbar ROM 12/13/21  LOWER EXTREMITY MMT:    MMT Right eval Left eval 10/11/21 R 10/11/21 L  12/13/21 Right / Left  Hip flexion 4 4 3 3  4-   3+  Hip extension 3+ 3+ 3 3 3  / 3-  Hip abduction 4+ 4+ 4+ 4 5-  Hip adduction       Hip internal rotation       Hip external rotation       Knee flexion 4+ 4 4 4+ 5-  Knee extension 4+ 4+ 4+ 4+ 5-  Ankle dorsiflexion 5 5 5 5 5   Ankle plantarflexion       Ankle inversion       Ankle eversion        (Blank rows = not tested)  LUMBAR SPECIAL TESTS:    FUNCTIONAL TESTS: 12/13/21 30s STS 3.5 Berg:39/56 BERG Balance Test          Date: 12/13/21  Sit to Stand 2  Standing unsupported 4  Sitting with back unsupported but feet supported 4  Stand to sit  3  Transfers  3  Standing unsupported with eyes closed 4  Standing unsupported feet together  4  From standing position, reach forward with outstretched arm 3  From standing position, pick up object from floor 3  From standing position, turn and look behind over each shoulder 3  Turn 360 2  Standing unsupported,  alternately place foot on step 2  Standing unsupported, one foot in front 0  Standing on one leg 2  Total:  39     GAIT: Distance walked: in clinic distances  Assistive device utilized: None Level of assistance: Complete Independence Comments: antalgic, limited hip rotation, flexed at hips, holds trunk very rigid     TODAY'S TREATMENT  BERG MMT   Pt seen for aquatic therapy today.  Treatment took place in water 3.25-51ft 8" in depth at the [facility] pool. Temp of water was 92.  Pt entered/exited the pool via stairs independently with bilat rail.  * walking unsupported forward/ backward , side stepping R/L * single yellow hand float at side, then bilat: walking forward/ backward * side stepping with arm abdct/ add with yellow hand floats x 4 laps  * SLS; SLS with bilat shoulder horiz abdct/adddct with vision then VE   *core engagement: hand bells used with oscillation in frontal and sagittal planes 2 sets in ea position  feet staggard then wide stance * L stretch at wall x 3 reps  * return to walking forward/ backward and light jog forward / backward  * straddling noodle and cycling with breast stroke arms     Pt requires the buoyancy and hydrostatic pressure of water for support, and to offload joints by unweighting joint load by at least 50 % in navel deep water and by at least 75-80% in chest to neck deep water.  Viscosity of the water is needed for resistance of strengthening. Water current perturbations provides challenge to standing balance requiring increased core activation     PATIENT EDUCATION:  Education details: aquatics progression Person educated: Patient Education method: Customer service manager Education comprehension: verbalized understanding and returned demonstration   HOME EXERCISE PROGRAM: Access Code: [email or web address] URL: [email or web address] Date: 01/17/2022 Prepared by: Denton Meek  Added to 01/23/22 FZ  Exercises -  Kettlebell Suitcase Carry  - 1 x daily - 7 x weekly - 3 sets - 10 reps - Side Stepping with Hand Floats  - 1 x daily - 7 x weekly - 3 sets - 10 reps - Single Leg Stance at Pool Wall  - 1 x daily - 7 x weekly - 3 sets - 10 reps - Seated Straddle on Flotation Forward Breast Stroke Arms and Bicycle Legs  - 1 x daily - 7 x weekly - 3 sets - 10 reps - Standing 'L' Stretch at Counter  - 1 x daily - 7 x weekly - 3 sets - 10 reps - Standing Bilateral Low Shoulder Row with Anchored Resistance  - 1 x daily - 7 x weekly - 3 sets - 10 reps - Standing Hip Hinge  - 1 x daily - 7 x weekly - 3 sets - 10 reps - Sit to Stand  - 1 x daily - 7 x weekly - 3 sets - 10 reps  ASSESSMENT:  CLINICAL IMPRESSION:  Pt has progressed her own indep exercises to going to gym and water classes at the [facility].  Her pain in LB is much improved 1/10 and she verbalizes understanding of management of her LBP which seems to be becoming chronic.  She tolerates increased intensity of core strengthening with reports of just tiredness in right  LB area. Began updated and final HEP. Few goals met. She will be ready for DC at and of cert.    OBJECTIVE IMPAIRMENTS Abnormal gait, difficulty walking, decreased ROM, decreased strength, hypomobility, increased fascial restrictions, increased muscle spasms, impaired flexibility, impaired sensation, improper body mechanics, postural dysfunction, obesity, and pain.   ACTIVITY LIMITATIONS carrying, lifting, bending, sitting, standing, squatting, stairs, transfers, locomotion level, and caring for others  PARTICIPATION LIMITATIONS: driving, shopping, community activity, occupation, and yard work  PERSONAL FACTORS Age, Fitness, Past/current experiences, Profession, and Time since onset of injury/illness/exacerbation are also affecting patient's functional outcome.   REHAB POTENTIAL: Good  CLINICAL DECISION MAKING: Stable/uncomplicated  EVALUATION COMPLEXITY: Low   GOALS: Goals reviewed with  patient? Yes  SHORT TERM GOALS: Target date: 09/13/2021  Will be compliant with appropriate progressive HEP  Baseline: Goal status: 10/3- ONGOING, doing some of HEP daily . Met 01/17/22 2.  Pain to be no more than 4/10 at worst and sciatic pain with have improved by 50% in intensity  Baseline: Goal status: 10/3- ONGOING still gets to 9-10/10 at worst, sciatic pain has good and bad days. Pain depends on the day                       12/5: Achieved  3.  Will have better understanding of posture and biomechanics  Baseline:  Goal status: Achieved: 11/04/21  4.  Will be able to climb flight of step with U rail and step to pattern without increase in pain  Baseline:  Goal status: Achieved: 11/04/21    LONG TERM GOALS: Target date: 01/24/2021  MMT to be 5/5 globally  Baseline:  Goal status: IN PROGRESS improving  2.  Pain to be no more than 2/10 at worst with functional task performance  Baseline:  Goal status: 10/3- ONGOING can still get to 2/10  12/5-improving Met 01/17/22  3.  Will be able to ascend/descent full flight of steps with no rails and reciprocal pattern, no increase in pain  Baseline: intermittent use of rails Goal status: Partially met - 11/04/21                     12/5: continues with intermittent use of rails  4.  Will demonstrate ability to perform mock/simulated patient transfers with good mechanics in order to assist in preventing injury at her job  Baseline:  Goal status: Achieved - 11/04/21  5. Pt will Improve Glute and hamstring strength up to 1 full grade to improve functional mobility  Baseline:3/3-  Goal Status: New  6. Pt will be indep with final aquatic and land based HEP to maintain gains made and have better management of chronic condition  Baseline: No aquatic program  Goal status: New  7. Pt will improve on Berg Balance test to up or > 50 to demonstrate improved balance ability and decreased fall risk.  Baseline:39/56  Goal Status:  New      PLAN: PT FREQUENCY: 1-2 x week  PT DURATION: 6 weeks  PLANNED INTERVENTIONS: Therapeutic exercises, Therapeutic activity, Neuromuscular re-education, Balance training, Gait training, Patient/Family education, Self Care, Joint mobilization, Stair training, DME instructions, Aquatic Therapy, Dry Needling, Electrical stimulation, Spinal mobilization, Cryotherapy, Moist heat, Taping, Traction, Ultrasound, Ionotophoresis 4mg /ml Dexamethasone, Manual therapy, and Re-evaluation.  PLAN FOR NEXT SESSION:   posterior chain strengthening, balance retraining, core strengthening.  Continue d/c planning.   Stanton Kidney [place]) Mavin Dyke MPT 01/17/22 [facility] [address]  [place], [place], [address] Phone: [phone]   Fax:  [phone]

## 2022-01-23 NOTE — Therapy (Incomplete)
OUTPATIENT PHYSICAL THERAPY THORACOLUMBAR TREATMENT NOTE   Patient Name: Veronica Gonzales MRN: [id number] DOB:[birth year], 52 y.o., female Today's Date: 01/17/2022   PT End of Session - 01/17/22 1341     Visit Number 24    Number of Visits 30    Date for PT Re-Evaluation 01/24/22    Authorization Time Period 08/16/21 to 10/11/21; extended to 11/07/21; extended to Jan 16    PT Start Time 0955   pt late for appt   PT Stop Time 1033    PT Time Calculation (min) 38 min    Activity Tolerance Patient tolerated treatment well    Behavior During Therapy [facility] for tasks assessed/performed                 Past Medical History:  Diagnosis Date   Hypertension    Sciatic nerve pain    Past Surgical History:  Procedure Laterality Date   CARPAL TUNNEL RELEASE     CESAREAN SECTION     Patient Active Problem List   Diagnosis Date Noted   Urticaria 01/13/2022   Lumbar radiculopathy 04/13/2021    PCP: does not currently have   REFERRING PROVIDER: Melina Schools, MD   REFERRING DIAG: M54.51 (ICD-10-CM) - Vertebrogenic low back pain   Rationale for Evaluation and Treatment Rehabilitation  THERAPY DIAG:  Other low back pain  Muscle weakness (generalized)  Other symptoms and signs involving the musculoskeletal system  ONSET DATE: 07/28/2021   SUBJECTIVE:                                                                                                                                                                                          SUBJECTIVE STATEMENT: Pt reports she went to [employer] yesterday and used the elliptical, recumbent bike and treatmill.  Treadmill caused tingling across LB and stopped      PERTINENT HISTORY:  MD Summary: Veronica Gonzales returns today for follow-up. Unfortunately 3 to 5 days after she left my office she contracted COVID and was unable to attend physical therapy. She states that she has done some self-directed exercises and is noted significant  improvement.   At this point I have refilled her gabapentin as this is helping to relieve her pain and we will start formalized aquatic physical therapy. If she fails to improve or there is worsening of her symptoms she knows to contact me I will be happy to see her back. Otherwise, she can follow-up with me on an as-needed basis.     PAIN:  Are you having pain? Yes: NPRS scale: 1/10, Pain location: lower back (bilat) into Lt posterior/lateral hip, numbness into Lt toes  Pain description: OA pain going across back,  Aggravating factors: work, getting up in the morning  Relieving factors: other than pool therapy, pain meds    PRECAUTIONS: None  WEIGHT BEARING RESTRICTIONS No  FALLS:  Has patient fallen in last 6 months? No  LIVING ENVIRONMENT: Lives with: lives alone Lives in: House/apartment Stairs: Yes: Internal: 16 steps; on right going up Has following equipment at home: None  OCCUPATION: nurse aide   PLOF: Independent, Independent with basic ADLs, Independent with gait, and Independent with transfers  PATIENT GOALS get pain down, be able to get upstairs easier, build endurance back up    OBJECTIVE: *findings taken at evaluation unless otherwise noted.   DIAGNOSTIC FINDINGS:    PATIENT SURVEYS: 12/13/21 Foto 90 with goal 53%  SCREENING FOR RED FLAGS: Bowel or bladder incontinence: No Spinal tumors: No Cauda equina syndrome: No Compression fracture: No Abdominal aneurysm: No  COGNITION:  Overall cognitive status: Within functional limits for tasks assessed     SENSATION: Not tested  MUSCLE LENGTH: Hamstrings WNL R, moderate limitation L  Piriformis WNL R, moderate limitation L  POSTURE: rounded shoulders, forward head, increased thoracic kyphosis, and flexed trunk   PALPATION: Lumbar and thoracic paraspinals tight but not TTP, no areas excessively tight or sore in B glutes or piriformis groups; 10/3- still very tight but not TTP in lumbar paraspinals    LUMBAR ROM:   Active  A/PROM  eval 10/11/21 12/13/21  Flexion Mild limitation, RFIS no change in pain Mild limitation, mild HS tightness  WFL  Extension WNL, REIS improvement in pain  WNL, perhaps even a bit hypermobile  WFL  Right lateral flexion Moderate limitation  Moderate limitation  Min limitation  Left lateral flexion Moderate limitation  Severe limitation  Min limitation  Right rotation     Left rotation      (Blank rows = not tested)  **no pain with lumbar ROM 12/13/21  LOWER EXTREMITY MMT:    MMT Right eval Left eval 10/11/21 R 10/11/21 L  12/13/21 Right / Left  Hip flexion 4 4 3 3  4-   3+  Hip extension 3+ 3+ 3 3 3  / 3-  Hip abduction 4+ 4+ 4+ 4 5-  Hip adduction       Hip internal rotation       Hip external rotation       Knee flexion 4+ 4 4 4+ 5-  Knee extension 4+ 4+ 4+ 4+ 5-  Ankle dorsiflexion 5 5 5 5 5   Ankle plantarflexion       Ankle inversion       Ankle eversion        (Blank rows = not tested)  LUMBAR SPECIAL TESTS:    FUNCTIONAL TESTS: 12/13/21 30s STS 3.5 Berg:39/56 BERG Balance Test          Date: 12/13/21  Sit to Stand 2  Standing unsupported 4  Sitting with back unsupported but feet supported 4  Stand to sit  3  Transfers  3  Standing unsupported with eyes closed 4  Standing unsupported feet together 4  From standing position, reach forward with outstretched arm 3  From standing position, pick up object from floor 3  From standing position, turn and look behind over each shoulder 3  Turn 360 2  Standing unsupported, alternately place foot on step 2  Standing unsupported, one foot in front 0  Standing on one leg 2  Total:  39     GAIT: Distance  walked: in clinic distances  Assistive device utilized: None Level of assistance: Complete Independence Comments: antalgic, limited hip rotation, flexed at hips, holds trunk very rigid     TODAY'S TREATMENT  Pt seen for aquatic therapy today.  Treatment took place in water 3.25-10ft 8"  in depth at the [facility] pool. Temp of water was 92.  Pt entered/exited the pool via stairs independently with bilat rail.  * walking unsupported forward/ backward , side stepping R/L * single yellow hand float at side, then bilat: walking forward/ backward * side stepping with arm abdct/ add with yellow hand floats x 4 laps  * SLS; SLS with bilat shoulder horiz abdct/adddct with vision then VE   *core engagement: hand bells used with oscillation in frontal and sagittal planes 2 sets in ea position  feet staggard then wide stance * L stretch at wall x 3 reps  * return to walking forward/ backward and light jog forward / backward  * straddling noodle and cycling with breast stroke arms     Pt requires the buoyancy and hydrostatic pressure of water for support, and to offload joints by unweighting joint load by at least 50 % in navel deep water and by at least 75-80% in chest to neck deep water.  Viscosity of the water is needed for resistance of strengthening. Water current perturbations provides challenge to standing balance requiring increased core activation     PATIENT EDUCATION:  Education details: aquatics progression Person educated: Patient Education method: Medical illustrator Education comprehension: verbalized understanding and returned demonstration   HOME EXERCISE PROGRAM: Access Code: [email or web address] URL: [email or web address] Date: 01/17/2022 Prepared by: Geni Bers  Exercises - Kettlebell Suitcase Carry  - 1 x daily - 7 x weekly - 3 sets - 10 reps - Side Stepping with Hand Floats  - 1 x daily - 7 x weekly - 3 sets - 10 reps - Single Leg Stance at Pool Wall  - 1 x daily - 7 x weekly - 3 sets - 10 reps - Seated Straddle on Flotation Forward Breast Stroke Arms and Bicycle Legs  - 1 x daily - 7 x weekly - 3 sets - 10 reps  ASSESSMENT:  CLINICAL IMPRESSION:  Pt has progressed her own indep exercises to going to gym and water classes at  the [facility].  Her pain in LB is much improved 1/10 and she verbalizes understanding of management of her LBP which seems to be becoming chronic.  She tolerates increased intensity of core strengthening with reports of just tiredness in right LB area. Began updated and final HEP. Few goals met. She will be ready for DC at and of cert.    OBJECTIVE IMPAIRMENTS Abnormal gait, difficulty walking, decreased ROM, decreased strength, hypomobility, increased fascial restrictions, increased muscle spasms, impaired flexibility, impaired sensation, improper body mechanics, postural dysfunction, obesity, and pain.   ACTIVITY LIMITATIONS carrying, lifting, bending, sitting, standing, squatting, stairs, transfers, locomotion level, and caring for others  PARTICIPATION LIMITATIONS: driving, shopping, community activity, occupation, and yard work  PERSONAL FACTORS Age, Fitness, Past/current experiences, Profession, and Time since onset of injury/illness/exacerbation are also affecting patient's functional outcome.   REHAB POTENTIAL: Good  CLINICAL DECISION MAKING: Stable/uncomplicated  EVALUATION COMPLEXITY: Low   GOALS: Goals reviewed with patient? Yes  SHORT TERM GOALS: Target date: 09/13/2021  Will be compliant with appropriate progressive HEP  Baseline: Goal status: 10/3- ONGOING, doing some of HEP daily . Met 01/17/22 2.  Pain to be no more than 4/10 at  worst and sciatic pain with have improved by 50% in intensity  Baseline: Goal status: 10/3- ONGOING still gets to 9-10/10 at worst, sciatic pain has good and bad days. Pain depends on the day                       12/5: Achieved  3.  Will have better understanding of posture and biomechanics  Baseline:  Goal status: Achieved: 11/04/21  4.  Will be able to climb flight of step with U rail and step to pattern without increase in pain  Baseline:  Goal status: Achieved: 11/04/21    LONG TERM GOALS: Target date: 01/24/2021  MMT to be 5/5 globally   Baseline:  Goal status: IN PROGRESS improving  2.  Pain to be no more than 2/10 at worst with functional task performance  Baseline:  Goal status: 10/3- ONGOING can still get to 2/10  12/5-improving Met 01/17/22  3.  Will be able to ascend/descent full flight of steps with no rails and reciprocal pattern, no increase in pain  Baseline: intermittent use of rails Goal status: Partially met - 11/04/21                     12/5: continues with intermittent use of rails  4.  Will demonstrate ability to perform mock/simulated patient transfers with good mechanics in order to assist in preventing injury at her job  Baseline:  Goal status: Achieved - 11/04/21  5. Pt will Improve Glute and hamstring strength up to 1 full grade to improve functional mobility  Baseline:3/3-  Goal Status: New  6. Pt will be indep with final aquatic and land based HEP to maintain gains made and have better management of chronic condition  Baseline: No aquatic program  Goal status: New  7. Pt will improve on Berg Balance test to up or > 50 to demonstrate improved balance ability and decreased fall risk.  Baseline:39/56  Goal Status: New      PLAN: PT FREQUENCY: 1-2 x week  PT DURATION: 6 weeks  PLANNED INTERVENTIONS: Therapeutic exercises, Therapeutic activity, Neuromuscular re-education, Balance training, Gait training, Patient/Family education, Self Care, Joint mobilization, Stair training, DME instructions, Aquatic Therapy, Dry Needling, Electrical stimulation, Spinal mobilization, Cryotherapy, Moist heat, Taping, Traction, Ultrasound, Ionotophoresis 4mg /ml Dexamethasone, Manual therapy, and Re-evaluation.  PLAN FOR NEXT SESSION:   posterior chain strengthening, balance retraining, core strengthening.  Continue d/c planning.   [place]) Trea Carnegie MPT 01/17/22 [address] [facility] [address] [place], [place], [place] Phone: [phone]   Fax:   [phone]

## 2022-01-24 ENCOUNTER — Encounter (HOSPITAL_BASED_OUTPATIENT_CLINIC_OR_DEPARTMENT_OTHER): Payer: Self-pay

## 2022-01-24 ENCOUNTER — Ambulatory Visit (HOSPITAL_BASED_OUTPATIENT_CLINIC_OR_DEPARTMENT_OTHER): Payer: Commercial Managed Care - HMO | Admitting: Physical Therapy

## 2022-01-25 ENCOUNTER — Inpatient Hospital Stay (HOSPITAL_BASED_OUTPATIENT_CLINIC_OR_DEPARTMENT_OTHER)
Admission: RE | Admit: 2022-01-25 | Payer: Commercial Managed Care - HMO | Source: Ambulatory Visit | Admitting: Radiology

## 2022-01-25 ENCOUNTER — Ambulatory Visit (HOSPITAL_BASED_OUTPATIENT_CLINIC_OR_DEPARTMENT_OTHER): Admission: RE | Admit: 2022-01-25 | Payer: Commercial Managed Care - HMO | Source: Ambulatory Visit

## 2022-01-26 ENCOUNTER — Encounter (HOSPITAL_BASED_OUTPATIENT_CLINIC_OR_DEPARTMENT_OTHER): Payer: Commercial Managed Care - HMO | Admitting: Physical Therapy

## 2022-01-30 ENCOUNTER — Other Ambulatory Visit (HOSPITAL_BASED_OUTPATIENT_CLINIC_OR_DEPARTMENT_OTHER): Payer: Self-pay | Admitting: Obstetrics & Gynecology

## 2022-01-30 DIAGNOSIS — Z1231 Encounter for screening mammogram for malignant neoplasm of breast: Secondary | ICD-10-CM

## 2022-01-31 ENCOUNTER — Ambulatory Visit (HOSPITAL_BASED_OUTPATIENT_CLINIC_OR_DEPARTMENT_OTHER): Payer: Commercial Managed Care - HMO | Admitting: Physical Therapy

## 2022-02-01 ENCOUNTER — Encounter (HOSPITAL_BASED_OUTPATIENT_CLINIC_OR_DEPARTMENT_OTHER): Payer: Self-pay

## 2022-02-01 ENCOUNTER — Ambulatory Visit (HOSPITAL_BASED_OUTPATIENT_CLINIC_OR_DEPARTMENT_OTHER)
Admission: RE | Admit: 2022-02-01 | Discharge: 2022-02-01 | Disposition: A | Discharge status: Home / Self Care | Payer: Commercial Managed Care - HMO | Source: Ambulatory Visit | Attending: Obstetrics & Gynecology | Admitting: Obstetrics & Gynecology

## 2022-02-01 DIAGNOSIS — R222 Localized swelling, mass and lump, trunk: Secondary | ICD-10-CM | POA: Diagnosis present

## 2022-02-01 DIAGNOSIS — Z1231 Encounter for screening mammogram for malignant neoplasm of breast: Secondary | ICD-10-CM | POA: Insufficient documentation

## 2022-02-02 ENCOUNTER — Encounter (HOSPITAL_BASED_OUTPATIENT_CLINIC_OR_DEPARTMENT_OTHER): Payer: Commercial Managed Care - HMO | Admitting: Physical Therapy

## 2022-02-03 ENCOUNTER — Other Ambulatory Visit: Payer: Self-pay | Admitting: Obstetrics & Gynecology

## 2022-02-03 DIAGNOSIS — R928 Other abnormal and inconclusive findings on diagnostic imaging of breast: Secondary | ICD-10-CM

## 2022-02-07 ENCOUNTER — Ambulatory Visit (HOSPITAL_BASED_OUTPATIENT_CLINIC_OR_DEPARTMENT_OTHER): Payer: Commercial Managed Care - HMO | Admitting: Physical Therapy

## 2022-02-08 ENCOUNTER — Ambulatory Visit
Admission: RE | Admit: 2022-02-08 | Discharge: 2022-02-08 | Disposition: A | Discharge status: Home / Self Care | Payer: Commercial Managed Care - HMO | Source: Ambulatory Visit | Attending: Obstetrics & Gynecology | Admitting: Obstetrics & Gynecology

## 2022-02-08 DIAGNOSIS — R928 Other abnormal and inconclusive findings on diagnostic imaging of breast: Secondary | ICD-10-CM

## 2022-02-09 ENCOUNTER — Encounter (HOSPITAL_BASED_OUTPATIENT_CLINIC_OR_DEPARTMENT_OTHER): Payer: Commercial Managed Care - HMO | Admitting: Physical Therapy

## 2022-02-15 ENCOUNTER — Ambulatory Visit (INDEPENDENT_AMBULATORY_CARE_PROVIDER_SITE_OTHER): Payer: Self-pay

## 2022-02-15 ENCOUNTER — Ambulatory Visit (INDEPENDENT_AMBULATORY_CARE_PROVIDER_SITE_OTHER): Payer: 59 | Admitting: Obstetrics & Gynecology

## 2022-02-15 ENCOUNTER — Encounter (HOSPITAL_BASED_OUTPATIENT_CLINIC_OR_DEPARTMENT_OTHER): Payer: Self-pay | Admitting: Obstetrics & Gynecology

## 2022-02-15 VITALS — BP 148/102 | HR 62 | Ht 60.0 in | Wt 240.0 lb

## 2022-02-15 DIAGNOSIS — N924 Excessive bleeding in the premenopausal period: Secondary | ICD-10-CM | POA: Diagnosis not present

## 2022-02-15 DIAGNOSIS — I1A0 Resistant hypertension: Secondary | ICD-10-CM | POA: Diagnosis not present

## 2022-02-15 DIAGNOSIS — N926 Irregular menstruation, unspecified: Secondary | ICD-10-CM

## 2022-02-15 NOTE — Patient Instructions (Addendum)
[facility] Address: [place], [place], [place] [address] Phone: [phone]  [facility] [address] [place],  [place]  [address] Main: [phone]

## 2022-02-16 LAB — FOLLICLE STIMULATING HORMONE: FSH: 22.6 m[IU]/mL

## 2022-02-19 DIAGNOSIS — N924 Excessive bleeding in the premenopausal period: Secondary | ICD-10-CM | POA: Insufficient documentation

## 2022-02-19 DIAGNOSIS — I1A0 Resistant hypertension: Secondary | ICD-10-CM | POA: Insufficient documentation

## 2022-02-19 NOTE — Progress Notes (Signed)
GYNECOLOGY  VISIT  CC:   discuss ultrasound findings, pregnancy risk  HPI: 52 y.o. [phone] Single Black or African American female here for discussion of ultrasound results.  Uterus 8 x 4.4 x 6.0cm  with endometrium 71m, symmetric.   Ovaries normal.  Pt reassured by ultrasound findings.  Pt have irregular cycles this year.  Wants to know about pregnancy risk.  [place]  Average age of menopause discussed.  Has now been several months since heavy bleeding.  Last cycles was 12/13/2021 and was short.    Blood pressure is elevated and she is on two medications for treatment.  Suggested/offered referral to cardiologist for possible improved blood pressure control.  Pt welcomed this referral so it will be placed.     Past Medical History:  Diagnosis Date   Hypertension    Sciatic nerve pain     MEDS:   Current Outpatient Medications on File Prior to Visit  Medication Sig Dispense Refill   amLODipine (NORVASC) 10 MG tablet Take 10 mg by mouth daily.     cloNIDine (CATAPRES) 0.1 MG tablet Take 0.1 mg by mouth 2 (two) times daily.     gabapentin (NEURONTIN) 300 MG capsule Take 300 mg by mouth 2 (two) times daily.     meloxicam (MOBIC) 15 MG tablet Take 15 mg by mouth as needed.     traMADol (ULTRAM) 50 MG tablet Take 50 mg by mouth every 12 (twelve) hours as needed.     No current facility-administered medications on file prior to visit.    ALLERGIES: Patient has no allergy information on record.  SH:  single, non smoker  Review of Systems  Constitutional: Negative.     PHYSICAL EXAMINATION:    BP (!) 148/102 (BP Location: Right Arm, Patient Position: Sitting, Cuff Size: Large)   Pulse 62   Ht 5' (1.524 m) Comment: Reported  Wt 240 lb (108.9 kg)   BMI 46.87 kg/m     General appearance: alert, cooperative and appears stated age  Assessment/Plan: 1. Abnormal perimenopausal bleeding - will check [place]  If >40, will have pt return for endometrial biopsy -  given lighter bleeding the last few months, feel ok to monitor.  If has another heavy cycles, I would like to know this - Follicle stimulating hormone  2. Resistant hypertension - Ambulatory referral to Cardiology

## 2022-02-27 ENCOUNTER — Encounter: Payer: Self-pay | Admitting: *Deleted

## 2022-03-01 DIAGNOSIS — M5451 Vertebrogenic low back pain: Secondary | ICD-10-CM | POA: Diagnosis not present

## 2022-03-01 DIAGNOSIS — M5416 Radiculopathy, lumbar region: Secondary | ICD-10-CM | POA: Diagnosis not present

## 2022-03-01 DIAGNOSIS — M48061 Spinal stenosis, lumbar region without neurogenic claudication: Secondary | ICD-10-CM | POA: Diagnosis not present

## 2022-05-08 ENCOUNTER — Ambulatory Visit (HOSPITAL_BASED_OUTPATIENT_CLINIC_OR_DEPARTMENT_OTHER): Payer: 59 | Admitting: Cardiovascular Disease

## 2022-06-08 ENCOUNTER — Ambulatory Visit (HOSPITAL_BASED_OUTPATIENT_CLINIC_OR_DEPARTMENT_OTHER): Payer: Commercial Managed Care - HMO | Admitting: Cardiovascular Disease

## 2022-06-08 ENCOUNTER — Encounter (HOSPITAL_BASED_OUTPATIENT_CLINIC_OR_DEPARTMENT_OTHER): Payer: Self-pay | Admitting: Cardiovascular Disease

## 2022-06-08 ENCOUNTER — Encounter: Payer: Self-pay | Admitting: *Deleted

## 2022-06-08 VITALS — BP 154/92 | HR 77 | Ht 60.0 in | Wt 248.3 lb

## 2022-06-08 DIAGNOSIS — R0683 Snoring: Secondary | ICD-10-CM | POA: Diagnosis not present

## 2022-06-08 DIAGNOSIS — I10 Essential (primary) hypertension: Secondary | ICD-10-CM | POA: Diagnosis not present

## 2022-06-08 DIAGNOSIS — R400 Somnolence: Secondary | ICD-10-CM | POA: Diagnosis not present

## 2022-06-08 DIAGNOSIS — Z5181 Encounter for therapeutic drug level monitoring: Secondary | ICD-10-CM

## 2022-06-08 DIAGNOSIS — Z6841 Body Mass Index (BMI) 40.0 and over, adult: Secondary | ICD-10-CM

## 2022-06-08 DIAGNOSIS — Z006 Encounter for examination for normal comparison and control in clinical research program: Secondary | ICD-10-CM

## 2022-06-08 DIAGNOSIS — I1A0 Resistant hypertension: Secondary | ICD-10-CM

## 2022-06-08 HISTORY — DX: Morbid (severe) obesity due to excess calories: E66.01

## 2022-06-08 MED ORDER — SPIRONOLACTONE 25 MG PO TABS: 25.0000 mg | ORAL_TABLET | Freq: Every day | ORAL | 3 refills | Status: AC

## 2022-06-08 NOTE — Progress Notes (Signed)
Advanced Hypertension Clinic Initial Assessment:    Date:  06/08/2022   ID:  Veronica Gonzales, DOB 1970-11-04, MRN [id number]  PCP:  Marva Panda, NP  Cardiologist:  None  Nephrologist:  Referring MD: Jerene Bears, MD   CC: Hypertension  History of Present Illness:    Veronica Gonzales is a 52 y.o. female with a hx of hypertension here to establish care in the Advanced Hypertension Clinic.  She saw Dr. Hyacinth Meeker 02/2022 blood pressure was 148/102 on amlodipine and clonidine so she was referred to the advanced hypertension clinic.  Today, she states that she has had hypertension all her life. She was initially diagnosed at age 58, and started antihypertensives when she was 52 years old. Since that time, she has been on amlodipine initially, and eventually clonidine and metoprolol. In the office today her blood pressure was initially 147/98 and on recheck 154/92. Her readings at home have occasionally been similar to today's readings, and often closer to 135/70s. At times she has noticed her BP as low as 125 systolic when she is relaxing at home. She typically is compliant with her medications without missed doses. She believes that her hypertension is genetic; she reports strokes and hypertension in both sides of her family. Also she complains of some palpitations or skipping heart beats. Previously she had tachycardic heart rates up to 100-105 bpm at rest, so she had been started on metoprolol tartrate which has helped improve the palpitations. Since last March, she has suffered from chronic back pain. She had a back injury associated with her sciatic nerve. In the past year she has gained about 30 lbs as she was unable to be very active. She was participating in aquatic therapy, but had to stop last month due to prohibitive costs. Lately she continues to work on weight loss. She has already made good dietary changes. Every week she completes meal preparation including her salads. She is  following a diet with intermittent fasting, she eats every 16 hours. Typically she snacks on apples, but may occasionally lapse with potato chips. Usually she drinks 16 oz coffee every morning. Alcohol consumption is occasional, usually wine. She does monitor her sodium intake. If she doesn't her feet will swell which causes more difficulty with walking. She is taking 20 mg furosemide as needed for swelling. She endorses snoring, and has always felt tired when she wakes up in the mornings. She may fall asleep easily at times during the day. She denies any chest pain, shortness of breath, lightheadedness, headaches, syncope, orthopnea, or PND.  Previous antihypertensives:   Past Medical History:  Diagnosis Date   Hypertension    Morbid obesity (HCC) 06/08/2022   Sciatic nerve pain     Past Surgical History:  Procedure Laterality Date   CARPAL TUNNEL RELEASE     CESAREAN SECTION      Current Medications: Current Meds  Medication Sig   amLODipine (NORVASC) 5 MG tablet Take 5 mg by mouth 2 (two) times daily.   cloNIDine (CATAPRES) 0.1 MG tablet Take 0.1 mg by mouth 2 (two) times daily.   furosemide (LASIX) 20 MG tablet Take 20 mg by mouth as needed for fluid or edema.   gabapentin (NEURONTIN) 300 MG capsule Take 300 mg by mouth 2 (two) times daily.   meloxicam (MOBIC) 15 MG tablet Take 15 mg by mouth as needed.   metoprolol tartrate (LOPRESSOR) 25 MG tablet Take 25 mg by mouth 2 (two) times daily.   spironolactone (ALDACTONE) 25 MG  tablet Take 1 tablet (25 mg total) by mouth daily.   traMADol (ULTRAM) 50 MG tablet Take 50 mg by mouth every 12 (twelve) hours as needed.     Allergies:   Patient has no allergy information on record.   Social History   Socioeconomic History   Marital status: Single    Spouse name: Not on file   Number of children: Not on file   Years of education: Not on file   Highest education level: Not on file  Occupational History   Not on file  Tobacco Use    Smoking status: Former    Types: Cigarettes    Quit date: 2007    Years since quitting: 17.4   Smokeless tobacco: Never  Vaping Use   Vaping Use: Never used  Substance and Sexual Activity   Alcohol use: Yes   Drug use: Never   Sexual activity: Yes    Partners: Male    Birth control/protection: None  Other Topics Concern   Not on file  Social History Narrative   Not on file   Social Determinants of Health   Financial Resource Strain: Not on file  Food Insecurity: No Food Insecurity (06/08/2022)   Hunger Vital Sign    Worried About Running Out of Food in the Last Year: Never true    Ran Out of Food in the Last Year: Never true  Transportation Needs: No Transportation Needs (06/08/2022)   PRAPARE - Administrator, Civil Service (Medical): No    Lack of Transportation (Non-Medical): No  Physical Activity: Inactive (06/08/2022)   Exercise Vital Sign    Days of Exercise per Week: 0 days    Minutes of Exercise per Session: 0 min  Stress: Not on file  Social Connections: Not on file     Family History: The patient's family history includes Cancer in her father and mother; Heart attack (age of onset: 26) in her maternal grandmother; Stroke in her paternal grandfather.  ROS:   Please see the history of present illness.    (+) Palpitations (+) Chronic back pain (+) Intermittent LE edema (+) Snoring (+) Daytime somnolence All other systems reviewed and are negative.  EKGs/Labs/Other Studies Reviewed:    Chest CT  11/07/2019: IMPRESSION: 1. Septated fluid density mass at the level of the cervix. Please see separate ultrasound for further details. Gynecologic consultation is recommended. 2. Pelvic adenopathy may be reactive or malignant. Attention on follow-up examinations is recommended. 3. Cholelithiasis without acute inflammation. 4. Fat containing umbilical hernia. 5. Probable hepatic steatosis. 6. The reported left chest wall lump is not visualized on  this study. Consider further evaluation by ultrasound if this is a palpable abnormality. If it is related to the breast, mammography is recommended.   Aortic Atherosclerosis (ICD10-I70.0).   EKG:  EKG is personally reviewed. 06/08/2022:  Sinus rhythm. Rate 77 bpm.   Recent Labs: 01/13/2022: ALT 13; BUN 8; Creatinine, Ser 0.63; Hemoglobin 13.6; Platelets 234; Potassium 3.6; Sodium 138; TSH 1.820   Recent Lipid Panel    Component Value Date/Time   CHOL 282 (H) 01/13/2022 0939   TRIG 177 (H) 01/13/2022 0939   HDL 52 01/13/2022 0939   CHOLHDL 5.4 (H) 01/13/2022 0939   LDLCALC 197 (H) 01/13/2022 0939    Physical Exam:    VS:  BP (!) 154/92 (BP Location: Right Arm, Patient Position: Sitting, Cuff Size: Large)   Pulse 77   Ht 5' (1.524 m)   Wt 248 lb 4.8 oz (  112.6 kg)   BMI 48.49 kg/m  , BMI Body mass index is 48.49 kg/m. GENERAL:  Well appearing HEENT: Pupils equal round and reactive, fundi not visualized, oral mucosa unremarkable NECK:  No jugular venous distention, waveform within normal limits, carotid upstroke brisk and symmetric, no bruits, no thyromegaly LUNGS:  Clear to auscultation bilaterally HEART:  RRR.  PMI not displaced or sustained,S1 and S2 within normal limits, no S3, no S4, no clicks, no rubs, 1/6 systolic murmur ABD:  Flat, positive bowel sounds normal in frequency in pitch, no bruits, no rebound, no guarding, no midline pulsatile mass, no hepatomegaly, no splenomegaly EXT:  2 plus pulses throughout, Trace LE edema, no cyanosis no clubbing SKIN:  No rashes no nodules NEURO:  Cranial nerves II through XII grossly intact, motor grossly intact throughout PSYCH:  Cognitively intact, oriented to person place and time   ASSESSMENT/PLAN:    Resistant hypertension Blood pressure is uncontrolled on several medications.  She developed hypertension at very young age.  CT-A was negative for adrenal adenomas or renal artery stenosis in 2021.  Thyroid function is normal.  Recommend reducing caffeine.  She will work on increasing exercise to at least 150 minutes weekly.  Continue amlodipine, metoprolol, and clonidine.  She thinks that she was on a thiazide diuretic in the past.  She had one episode of hypokalemia.  Will add spironolactone 25mg  daily.  Check BMP in a week.  Referral to healthy weight and wellness.  Will also refer her to our care guide for stress management and lifestyle modifications.  She was given an hypertension clinic booklet and asked to check her pressures twice daily.  She was enrolled in our remote patient monitoring study and consents to monitoring in the [employer] remote patient monitoring system.  Morbid obesity (HCC) She has been struggling to lose weight.  We discussed the importance of increasing protein and fruits and vegetables while limiting carbohydrate intake.  We also discussed the fact that she may do better with small frequent meals rather than intermittent fasting.  We will refer her to healthy weight and wellness.  Will also start her on Wegovy 0.25 milligrams weekly for 4 weeks then 0.5 mg weekly.  Increase exercise as above.  Thyroid function is normal.   Screening for Secondary Hypertension:     06/08/2022   11:42 AM  Causes  Drugs/Herbals Screened     - Comments limiting salt, 16 oz coffee.  Occasional EtOH.  Renovascular HTN Screened     - Comments CT was - 10/2019  Sleep Apnea Screened     - Comments snores.  daytime somnolence  Thyroid Disease Screened  Hyperaldosteronism Screened     - Comments CT was - 10/2019  Pheochromocytoma N/A  Cushing's Syndrome N/A  Hyperparathyroidism N/A  Coarctation of the Aorta Screened     - Comments Blood pressure symmetric  Compliance Screened    Relevant Labs/Studies:    Latest Ref Rng & Units 01/13/2022    9:39 AM 11/07/2019   12:06 PM  Basic Labs  Sodium 134 - 144 mmol/L 138  136   Potassium 3.5 - 5.2 mmol/L 3.6  3.2   Creatinine 0.57 - 1.00 mg/dL 1.61  0.96         Latest Ref Rng & Units 01/13/2022    9:39 AM  Thyroid   TSH 0.450 - 4.500 uIU/mL 1.820       Disposition:    FU with APP/PharmD in 1 month for the next 3 months.  FU with Tricia [place] C. Duke Salvia, MD, [facility] in 4 months.  Medication Adjustments/Labs and Tests Ordered: Current medicines are reviewed at length with the patient today.  Concerns regarding medicines are outlined above.   Orders Placed This Encounter  Procedures   Basic metabolic panel   Ambulatory referral to Family Practice   EKG 12-Lead   [facility] Sleep Study   Meds ordered this encounter  Medications   spironolactone (ALDACTONE) 25 MG tablet    Sig: Take 1 tablet (25 mg total) by mouth daily.    Dispense:  90 tablet    Refill:  3   I,Mathew Stumpf,acting as a scribe for Chilton Si, MD.,have documented all relevant documentation on the behalf of Chilton Si, MD,as directed by  Chilton Si, MD while in the presence of Chilton Si, MD.  I, Mahogani Holohan C. Duke Salvia, MD have reviewed all documentation for this visit.  The documentation of the exam, diagnosis, procedures, and orders on 06/08/2022 are all accurate and complete.   Signed, Chilton Si, MD  06/08/2022 12:45 PM    [facility]

## 2022-06-08 NOTE — Research (Signed)
Veronica Gonzales met criteria for the Virtual care and social determinant interventions for the management of hypertension trial. The trial was discussed with her by Dr. Duke Salvia and myself including risk/benefits. She had the opportunity to read the consent and ask questions. The consent was signed and a copy was given to the patient. No study related procedures were performed prior to consent being signed.  Veronica Gonzales was randomized to Group 1: Advanced hypertension clinic.

## 2022-06-08 NOTE — Patient Instructions (Addendum)
Medication Instructions:  START SPIROLACTONE 25 MG DAILY     Labwork: BMET IN 1 WEEK    Testing/Procedures: [facility] HOME SLEEP STUDY    Follow-Up: 07/05/2022 at 9:30 am with Loura Back, [place] location   Referrals:  [facility]  IF YOU DO  NOT HEAR FROM THEM IN 2 WEEKS YOU CAN CALL THEM DIRECTLY AT THE NUMBER HIGHLIGHTED    Special Instructions:  MONITOR YOUR BLOOD PRESSURE TWICE A DAY, LOG IN THE BOOK PROVIDED. BRING THE BOOK AND YOUR BLOOD PRESSURE MACHINE TO YOUR FOLLOW UP IN 1 MONTH   OTHER INSTRUCTIONS:   WatchPAT?  Is a FDA cleared portable home sleep study test that uses a watch and 3 points of contact to monitor 7 different channels, including your heart rate, oxygen saturations, body position, snoring, and chest motion.  The study is easy to use from the comfort of your own home and accurately detect sleep apnea.  Before bed, you attach the chest sensor, attached the sleep apnea bracelet to your nondominant hand, and attach the finger probe.  After the study, the raw data is downloaded from the watch and scored for apnea events.   For more information: [email or web address]  Patient Testing Instructions:  Do not put battery into the device until bedtime when you are ready to begin the test. Please call the support number if you need assistance after following the instructions below: 24 hour support line- [phone] or [facility] support at [phone] (option 2)  Download the IntelWatchPAT One" app through the google play store or App Store  Be sure to turn on or enable access to bluetooth in settlings on your smartphone/ device  Make sure no other bluetooth devices are on and within the vicinity of your smartphone/ device and WatchPAT watch during testing.  Make sure to leave your smart phone/ device plugged in and charging all night.  When ready for bed:  Follow the instructions step by step in the WatchPAT One App to activate the testing  device. For additional instructions, including video instruction, visit the WatchPAT One video on Youtube. You can search for WatchPat One within Youtube (video is 4 minutes and 18 seconds) or enter: https://youtube/watch?[email or web address]_vbiwxE Please note: You will be prompted to enter a Pin to connect via bluetooth when starting the test. The PIN will be assigned to you when you receive the test.  The device is disposable, but it recommended that you retain the device until you receive a call letting you know the study has been received and the results have been interpreted.  We will let you know if the study did not transmit to [place] properly after the test is completed. You do not need to call us to confirm the receipt of the test.  Please complete the test within 48 hours of receiving PIN.   Frequently Asked Questions:  What is Watch Dennie Bible one?  A single use fully disposable home sleep apnea testing device and will not need to be returned after completion.  What are the requirements to use WatchPAT one?  The be able to have a successful watchpat one sleep study, you should have your Watch pat one device, your smart phone, watch pat one app, your PIN number and Internet access What type of phone do I need?  You should have a smart phone that uses Android 5.1 and above or any Iphone with IOS 10 and above How can I download the WatchPAT one app?  Based on your device type  search for WatchPAT one app either in google play for android devices or APP store for Iphone's Where will I get my PIN for the study?  Your PIN will be provided by your physician's office. It is used for authentication and if you lose/forget your PIN, please reach out to your providers office.  I do not have Internet at home. Can I do WatchPAT one study?  WatchPAT One needs Internet connection throughout the night to be able to transmit the sleep data. You can use your home/local internet or your cellular's data package. However, it is  always recommended to use home/local Internet. It is estimated that between 20MB-30MB will be used with each study.However, the application will be looking for space in the phone to start the study.  What happens if I lose internet or bluetooth connection?  During the internet disconnection, your phone will not be able to transmit the sleep data. All the data, will be stored in your phone. As soon as the internet connection is back on, the phone will being sending the sleep data. During the bluetooth disconnection, WatchPAT one will not be able to to send the sleep data to your phone. Data will be kept in the [facility] one until two devices have bluetooth connection back on. As soon as the connection is back on, WatchPAT one will send the sleep data to the phone.  How long do I need to wear the WatchPAT one?  After you start the study, you should wear the device at least 6 hours.  How far should I keep my phone from the device?  During the night, your phone should be within 15 feet.  What happens if I leave the room for restroom or other reasons?  Leaving the room for any reason will not cause any problem. As soon as your get back to the room, both devices will reconnect and will continue to send the sleep data. Can I use my phone during the sleep study?  Yes, you can use your phone as usual during the study. But it is recommended to put your watchpat one on when you are ready to go to bed.  How will I get my study results?  A soon as you completed your study, your sleep data will be sent to the provider. They will then share the results with you when they are ready.

## 2022-06-08 NOTE — Assessment & Plan Note (Addendum)
Blood pressure is uncontrolled on several medications.  She developed hypertension at very young age.  CT-A was negative for adrenal adenomas or renal artery stenosis in 2021.  Thyroid function is normal. Recommend reducing caffeine.  She will work on increasing exercise to at least 150 minutes weekly.  Continue amlodipine, metoprolol, and clonidine.  She thinks that she was on a thiazide diuretic in the past.  She had one episode of hypokalemia.  Will add spironolactone 25mg  daily.  Check BMP in a week.  Referral to healthy weight and wellness.  Will also refer her to our care guide for stress management and lifestyle modifications.  She was given an hypertension clinic booklet and asked to check her pressures twice daily.  She was enrolled in our remote patient monitoring study and consents to monitoring in the [employer] remote patient monitoring system.

## 2022-06-08 NOTE — Progress Notes (Deleted)
Advanced Hypertension Clinic Initial Assessment:    Date:  06/08/2022   ID:  Veronica Gonzales, DOB [birth year], MRN [id number]  PCP:  Marva Panda, NP  Cardiologist:  None  Nephrologist:  Referring MD: Jerene Bears, MD   CC: Hypertension  History of Present Illness:    Veronica Gonzales is a 52 y.o. female with a hx of hypertension here to establish care in the Advanced Hypertension Clinic.  She saw Dr. Hyacinth Meeker 02/2022 blood pressure was 148/102 on amlodipine and clonidine so she was referred to the advanced hypertension clinic.Marland Kitchen   Previous antihypertensives:  Secondary Causes of Hypertension  Medications/Herbal: OCP, steroids, stimulants, antidepressants, weight loss medication, immune suppressants, NSAIDs, sympathomimetics, alcohol, caffeine, licorice, ginseng, St. John's wort, chemo  Sleep Apnea Renal artery stenosis Hyperaldosteronism Hyper/hypothyroidism Pheochromocytoma: palpitations, tachycardia, headache, diaphoresis (plasma metanephrines) Cushing's syndrome: Cushingoid facies, central obesity, proximal muscle weakness, and ecchymoses, adrenal incidentaloma (cortisol) Coarctation of the aorta  Past Medical History:  Diagnosis Date   Hypertension    Sciatic nerve pain     Past Surgical History:  Procedure Laterality Date   CARPAL TUNNEL RELEASE     CESAREAN SECTION      Current Medications: No outpatient medications have been marked as taking for the 06/08/22 encounter (Appointment) with Chilton Si, MD.     Allergies:   Patient has no allergy information on record.   Social History   Socioeconomic History   Marital status: Single    Spouse name: Not on file   Number of children: Not on file   Years of education: Not on file   Highest education level: Not on file  Occupational History   Not on file  Tobacco Use   Smoking status: Former    Types: Cigarettes    Quit date: 2007    Years since quitting: 17.4   Smokeless tobacco: Never   Vaping Use   Vaping Use: Never used  Substance and Sexual Activity   Alcohol use: Yes   Drug use: Never   Sexual activity: Yes    Partners: Male    Birth control/protection: None  Other Topics Concern   Not on file  Social History Narrative   Not on file   Social Determinants of Health   Financial Resource Strain: Not on file  Food Insecurity: Not on file  Transportation Needs: Not on file  Physical Activity: Not on file  Stress: Not on file  Social Connections: Not on file     Family History: The patient's ***family history includes Cancer in her father and mother.  ROS:   Please see the history of present illness.    *** All other systems reviewed and are negative.  EKGs/Labs/Other Studies Reviewed:    EKG:  EKG is *** ordered today.  The ekg ordered today demonstrates ***  Recent Labs: 01/13/2022: ALT 13; BUN 8; Creatinine, Ser 0.63; Hemoglobin 13.6; Platelets 234; Potassium 3.6; Sodium 138; TSH 1.820   Recent Lipid Panel    Component Value Date/Time   CHOL 282 (H) 01/13/2022 0939   TRIG 177 (H) 01/13/2022 0939   HDL 52 01/13/2022 0939   CHOLHDL 5.4 (H) 01/13/2022 0939   LDLCALC 197 (H) 01/13/2022 0939    Physical Exam:   VS:  There were no vitals taken for this visit. , BMI There is no height or weight on file to calculate BMI. GENERAL:  Well appearing HEENT: Pupils equal round and reactive, fundi not visualized, oral mucosa unremarkable NECK:  No jugular venous distention,  waveform within normal limits, carotid upstroke brisk and symmetric, no bruits, no thyromegaly LYMPHATICS:  No cervical adenopathy LUNGS:  Clear to auscultation bilaterally HEART:  RRR.  PMI not displaced or sustained,S1 and S2 within normal limits, no S3, no S4, no clicks, no rubs, *** murmurs ABD:  Flat, positive bowel sounds normal in frequency in pitch, no bruits, no rebound, no guarding, no midline pulsatile mass, no hepatomegaly, no splenomegaly EXT:  2 plus pulses throughout, no  edema, no cyanosis no clubbing SKIN:  No rashes no nodules NEURO:  Cranial nerves II through XII grossly intact, motor grossly intact throughout PSYCH:  Cognitively intact, oriented to person place and time   ASSESSMENT/PLAN:    No problem-specific Assessment & Plan notes found for this encounter.   Screening for Secondary Hypertension: { Click here to document screening for secondary causes of HTN  :[id number]}    Relevant Labs/Studies:    Latest Ref Rng & Units 01/13/2022    9:39 AM 11/07/2019   12:06 PM  Basic Labs  Sodium 134 - 144 mmol/L 138  136   Potassium 3.5 - 5.2 mmol/L 3.6  3.2   Creatinine 0.57 - 1.00 mg/dL 4.09  8.11        Latest Ref Rng & Units 01/13/2022    9:39 AM  Thyroid   TSH 0.450 - 4.500 uIU/mL 1.820       Disposition:    FU with MD/PharmD in {gen number [id number]} {Days to years:10300}    Medication Adjustments/Labs and Tests Ordered: Current medicines are reviewed at length with the patient today.  Concerns regarding medicines are outlined above.  No orders of the defined types were placed in this encounter.  No orders of the defined types were placed in this encounter.    Signed, Chilton Si, MD  06/08/2022 7:58 AM    [facility]

## 2022-06-08 NOTE — Addendum Note (Signed)
Addended by: Regis Bill B on: 06/08/2022 02:27 PM   Modules accepted: Orders

## 2022-06-08 NOTE — Assessment & Plan Note (Signed)
She has been struggling to lose weight.  We discussed the importance of increasing protein and fruits and vegetables while limiting carbohydrate intake.  We also discussed the fact that she may do better with small frequent meals rather than intermittent fasting.  We will refer her to healthy weight and wellness.  Will also start her on Wegovy 0.25 milligrams weekly for 4 weeks then 0.5 mg weekly.  Increase exercise as above.  Thyroid function is normal.

## 2022-06-09 ENCOUNTER — Telehealth (HOSPITAL_BASED_OUTPATIENT_CLINIC_OR_DEPARTMENT_OTHER): Payer: Self-pay | Admitting: *Deleted

## 2022-06-09 NOTE — Telephone Encounter (Signed)
Morbid obesity (HCC) She has been struggling to lose weight.  We discussed the importance of increasing protein and fruits and vegetables while limiting carbohydrate intake.  We also discussed the fact that she may do better with small frequent meals rather than intermittent fasting.  We will refer her to healthy weight and wellness.  Will also start her on Wegovy 0.25 milligrams weekly for 4 weeks then 0.5 mg weekly.  Increase exercise as above.  Thyroid function is normal.  Above information received after patient left visit   Left message to call back

## 2022-06-21 LAB — BASIC METABOLIC PANEL
BUN/Creatinine Ratio: 17 (ref 9–23)
BUN: 13 mg/dL (ref 6–24)
CO2: 26 mmol/L (ref 20–29)
Calcium: 10.2 mg/dL (ref 8.7–10.2)
Chloride: 102 mmol/L (ref 96–106)
Creatinine, Ser: 0.77 mg/dL (ref 0.57–1.00)
Glucose: 108 mg/dL — ABNORMAL HIGH (ref 70–99)
Potassium: 4.6 mmol/L (ref 3.5–5.2)
Sodium: 139 mmol/L (ref 134–144)
eGFR: 92 mL/min/{1.73_m2} (ref >59)

## 2022-07-05 ENCOUNTER — Ambulatory Visit
Payer: Commercial Managed Care - HMO | Attending: Cardiology | Admitting: Pharmacist Clinician (PhC)/ Clinical Pharmacy Specialist

## 2022-07-05 ENCOUNTER — Other Ambulatory Visit (HOSPITAL_COMMUNITY): Payer: Self-pay

## 2022-07-05 ENCOUNTER — Encounter: Payer: Self-pay | Admitting: Pharmacist Clinician (PhC)/ Clinical Pharmacy Specialist

## 2022-07-05 ENCOUNTER — Other Ambulatory Visit: Payer: Self-pay | Admitting: Pharmacist Clinician (PhC)/ Clinical Pharmacy Specialist

## 2022-07-05 ENCOUNTER — Telehealth: Payer: Self-pay

## 2022-07-05 ENCOUNTER — Telehealth: Payer: Self-pay | Admitting: Pharmacist Clinician (PhC)/ Clinical Pharmacy Specialist

## 2022-07-05 VITALS — BP 125/84 | HR 70

## 2022-07-05 DIAGNOSIS — I1A0 Resistant hypertension: Secondary | ICD-10-CM

## 2022-07-05 MED ORDER — CLONIDINE HCL 0.1 MG PO TABS: 0.1000 mg | ORAL_TABLET | Freq: Two times a day (BID) | ORAL | 2 refills | Status: DC

## 2022-07-05 MED ORDER — METOPROLOL TARTRATE 25 MG PO TABS: 25.0000 mg | ORAL_TABLET | Freq: Two times a day (BID) | ORAL | 2 refills | Status: DC

## 2022-07-05 MED ORDER — AMLODIPINE BESYLATE 5 MG PO TABS: 5.0000 mg | ORAL_TABLET | Freq: Two times a day (BID) | ORAL | 2 refills | Status: DC

## 2022-07-05 NOTE — Assessment & Plan Note (Signed)
Patient has not met goal of at least 5% of body weight loss with comprehensive lifestyle modifications alone in the past 3-6 months. Pharmacotherapy is appropriate to pursue as augmentation. Will follow up on prior authorization status.   Advised patient on common side effects including nausea, diarrhea, dyspepsia, decreased appetite, and fatigue. Counseled patient on reducing meal size and how to titrate medication to minimize side effects. Patient aware to call if intolerable side effects or if experiencing dehydration, abdominal pain, or dizziness. Patient will adhere to dietary modifications and will target at least 150 minutes of moderate intensity exercise weekly.   Injection technique reviewed at today's visit.

## 2022-07-05 NOTE — Telephone Encounter (Signed)
   PER TEST CLAIM: ZEPBOUND IS A PLAN/BENEFIT EXCLUSION

## 2022-07-05 NOTE — Telephone Encounter (Signed)
    PER TEST CLAIM: MEMBER WILL BE RESPONSIBLE FOR 100% OF THE COST

## 2022-07-05 NOTE — Assessment & Plan Note (Signed)
Assessment: BP is uncontrolled in office BP 125/84 mmHg;  diastolic above the goal (<130/80). Hesitant to talk about med changes as many meds that were tried in the past "didn't work" Tolerates amlodipine, metoprolol and spironolactone well without any side effects Clonidine causes drowsiness Denies SOB, palpitation, chest pain, headaches,or swelling Reiterated the importance of regular exercise and low salt diet   Plan:  Continue taking current medications Patient to keep record of BP readings with heart rate and report to [place] at the next visit Patient to follow up with PharmD in 1 month  Labs ordered today:  none

## 2022-07-05 NOTE — Assessment & Plan Note (Deleted)
.  [email or web address]

## 2022-07-05 NOTE — Telephone Encounter (Signed)
Would you see if either Wegovy or Zepbound would be covered on her plan?  Thank you

## 2022-07-05 NOTE — Progress Notes (Unsigned)
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 Duke Salvia discussed wegovy - will see if covered    Not on File  History of Present Illness    Veronica Gonzales is a 52 y.o. female patient who was referred to the Advanced Hypertension Clinic by Dr. Valentina Shaggy after BP reading of 148/102. At the time she was on amlodipine and clonidine.  She told Dr. Duke Salvia she was initially diagnosed at age 70 and started medication by age 1.  Started on amlodipine, with clonidine and metoprolol added with time.  Dr. Duke Salvia added spironolactone 25 mg as well as Wegovy 0.25 mg for weight loss.  Will follow up to see if that PA was approved and what cost might be.  Labs drawn after starting spironolactone showed WNL.  Patient was agreeable to join our [facility] RMP research and was randomized to gropu 1.  Today she is in the office for her first follow up visit.  She has not heard anything about whether Reginal Lutes would be covered on her health plan, so we will look into that today.  States that she has taken many medications for her blood pressure over the years and many of them did not work.  Unknown if medications were tried as single agents or in combination with other meds.  She is doing better on her current regimen, however the clonidine does make her rather tired.    Blood Pressure Goal:  130/80  Current Medications: amlodipine 5 mg bid, clonidine 0.1 mg bid, metoprolol tart 25 mg bid, spironolactone 25 mg qd  Previously tried:   multiple medications "didn't work"  Family Hx: both sets of grandparents with hypertension and strokes; parents both died of cancer, daughter also has hypertension  Social  Hx:      Tobacco: quit 2007  Alcohol: occasional wine  Caffeine:  coffee once daily, no sodas, lots of water  Diet:  does meal prep. Lots of salads - chicken and beans for protein.  She showed me a picture of her dinner one evening and we discussed at length cutting the portions in half, especially if she will be getting a GLP1  Exercise: swims, had to stop for about 6 weeks, trying to get back, but having budget constraints  Home BP readings:    last 20 days   AM - 20 readings average 131/80  HR 74  PM - 17 readings average 136/84  HR 75   Accessory Clinical Findings    Lab Results  Component Value Date   CREATININE 0.77 06/20/2022   BUN 13 06/20/2022   NA 139 06/20/2022   K 4.6 06/20/2022   CL 102 06/20/2022   CO2 26 06/20/2022   Lab Results  Component Value Date   ALT 13 01/13/2022   AST 13 01/13/2022   ALKPHOS 93 01/13/2022   BILITOT 0.3 01/13/2022   Lab Results  Component Value Date   HGBA1C 6.1 (H) 01/13/2022    Screening for Secondary Hypertension: { Click here to document screening for secondary causes of HTN  :1}     06/08/2022   11:42 AM  Causes  Drugs/Herbals Screened     - Comments limiting salt, 16  oz coffee.  Occasional EtOH.  Renovascular HTN Screened     - Comments CT was - 10/2019  Sleep Apnea Screened     - Comments snores.  daytime somnolence  Thyroid Disease Screened  Hyperaldosteronism Screened     - Comments CT was - 10/2019  Pheochromocytoma N/A  Cushing's Syndrome N/A  Hyperparathyroidism N/A  Coarctation of the Aorta Screened     - Comments Blood pressure symmetric  Compliance Screened    Relevant Labs/Studies:    Latest Ref Rng & Units 06/20/2022    3:52 PM 01/13/2022    9:39 AM 11/07/2019   12:06 PM  Basic Labs  Sodium 134 - 144 mmol/L 139  138  136   Potassium 3.5 - 5.2 mmol/L 4.6  3.6  3.2   Creatinine 0.57 - 1.00 mg/dL 5.40  9.81  1.91        Latest Ref Rng & Units 01/13/2022    9:39 AM  Thyroid   TSH 0.450 - 4.500  uIU/mL 1.820                   Home Medications    Current Outpatient Medications  Medication Sig Dispense Refill   amLODipine (NORVASC) 5 MG tablet Take 5 mg by mouth 2 (two) times daily.     cloNIDine (CATAPRES) 0.1 MG tablet Take 0.1 mg by mouth 2 (two) times daily.     furosemide (LASIX) 20 MG tablet Take 20 mg by mouth as needed for fluid or edema.     gabapentin (NEURONTIN) 300 MG capsule Take 300 mg by mouth 2 (two) times daily.     meloxicam (MOBIC) 15 MG tablet Take 15 mg by mouth as needed.     metoprolol tartrate (LOPRESSOR) 25 MG tablet Take 25 mg by mouth 2 (two) times daily.     spironolactone (ALDACTONE) 25 MG tablet Take 1 tablet (25 mg total) by mouth daily. 90 tablet 3   traMADol (ULTRAM) 50 MG tablet Take 50 mg by mouth every 12 (twelve) hours as needed.     No current facility-administered medications for this visit.     Assessment & Plan       Resistant hypertension Assessment: BP is uncontrolled in office BP 125/84 mmHg;  diastolic above the goal (<130/80). Hesitant to talk about med changes as many meds that were tried in the past "didn't work" Tolerates amlodipine, metoprolol and spironolactone well without any side effects Clonidine causes drowsiness Denies SOB, palpitation, chest pain, headaches,or swelling Reiterated the importance of regular exercise and low salt diet   Plan:  Continue taking current medications Patient to keep record of BP readings with heart rate and report to [place] at the next visit Patient to follow up with PharmD in 1 month  Labs ordered today:  none   Morbid obesity (HCC) Patient has not met goal of at least 5% of body weight loss with comprehensive lifestyle modifications alone in the past 3-6 months. Pharmacotherapy is appropriate to pursue as augmentation. Will follow up on prior authorization status.   Advised patient on common side effects including nausea, diarrhea, dyspepsia, decreased appetite, and fatigue.  Counseled patient on reducing meal size and how to titrate medication to minimize side effects. Patient aware to call if intolerable side effects or if experiencing dehydration, abdominal pain, or dizziness. Patient will adhere to dietary modifications and will target at least 150 minutes of moderate intensity exercise weekly.   Injection technique reviewed at today's visit.  Tommy Medal PharmD CPP [facility]  [address] [place], [place] [address] [phone]

## 2022-07-05 NOTE — Patient Instructions (Signed)
Follow up appointment: July 25 at 11 am  Take your BP meds as follows: no changes to medication today  Check your blood pressure at home daily (if able) and keep record of the readings.  Hypertension "High blood pressure"  Hypertension is often called "The Silent Killer." It rarely causes symptoms until it is extremely  high or has done damage to other organs in the body. For this reason, you should have your  blood pressure checked regularly by your physician. We will check your blood pressure  every time you see a provider at one of our offices.   Your blood pressure reading consists of two numbers. Ideally, blood pressure should be  below 120/80. The first ("top") number is called the systolic pressure. It measures the  pressure in your arteries as your heart beats. The second ("bottom") number is called the diastolic pressure. It measures the pressure in your arteries as the heart relaxes between beats.  The benefits of getting your blood pressure under control are enormous. A 10-point  reduction in systolic blood pressure can reduce your risk of stroke by 27% and heart failure by 28%  Your blood pressure goal is < 130/80  To check your pressure at home you will need to:  1. Sit up in a chair, with feet flat on the floor and back supported. Do not cross your ankles or legs. 2. Rest your left arm so that the cuff is about heart level. If the cuff goes on your upper arm,  then just relax the arm on the table, arm of the chair or your lap. If you have a wrist cuff, we  suggest relaxing your wrist against your chest (think of it as Pledging the Flag with the  wrong arm).  3. Place the cuff snugly around your arm, about 1 inch above the crook of your elbow. The  cords should be inside the groove of your elbow.  4. Sit quietly, with the cuff in place, for about 5 minutes. After that 5 minutes press the power  button to start a reading. 5. Do not talk or move while the reading is  taking place.  6. Record your readings on a sheet of paper. Although most cuffs have a memory, it is often  easier to see a pattern developing when the numbers are all in front of you.  7. You can repeat the reading after 1-3 minutes if it is recommended  Make sure your bladder is empty and you have not had caffeine or tobacco within the last 30 min  Always bring your blood pressure log with you to your appointments. If you have not brought your monitor in to be double checked for accuracy, please bring it to your next appointment.    We will start the prior authorization process to get [facility] covered by your insurance.   TIPS FOR SUCCESS Write down the reasons why you want to lose weight and post it in a place where you'll see it often. Start small and work your way up. Keep in mind that it takes time to achieve goals, and small steps add up. Any additional movements help to burn calories. Taking the stairs rather than the elevator and parking at the far end of your parking lot are easy ways to start. Brisk walking for at least 30 minutes 4 or more days of the week is an excellent goal to work toward  [employer] WHAT IT MEANS TO FEEL FULL Did you know that it  can take 15 minutes or more for your brain to receive the message that you've eaten? That means that, if you eat less food, but consume it slower, you may still feel satisfied. Eating a lot of fruits and vegetables can also help you feel fuller. Eat off of smaller plates so that moderate portions don't seem too small  TITRATION PLAN Will plan to follow the titration plan as below, pending patient is tolerating each dose before increasing to the next. Can slow titration if needed for tolerability.    If you have any questions or concerns, please reach out to [place].  Farhan Jean/Chris at [phone].  THANK YOU FOR CHOOSING [facility]

## 2022-07-07 NOTE — Telephone Encounter (Signed)
Discussed with Pharm D at follow up

## 2022-08-03 ENCOUNTER — Ambulatory Visit
Payer: Commercial Managed Care - HMO | Attending: Internal Medicine | Admitting: Pharmacist Clinician (PhC)/ Clinical Pharmacy Specialist

## 2022-08-03 VITALS — BP 112/82 | HR 74

## 2022-08-03 DIAGNOSIS — I1A0 Resistant hypertension: Secondary | ICD-10-CM | POA: Diagnosis not present

## 2022-08-03 NOTE — Progress Notes (Signed)
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 Duke Salvia discussed wegovy - will see if covered    Not on File  History of Present Illness    Veronica Gonzales is a 52 y.o. female patient who was referred to the [facility] by Dr. Valentina Shaggy after BP reading of 148/102. At the time she was on amlodipine and clonidine.  She told Dr. Duke Salvia she was initially diagnosed at age 40 and started medication by age 80.  Started on amlodipine, with clonidine and metoprolol added with time.  Dr. Duke Salvia added spironolactone 25 mg as well as Wegovy 0.25 mg for weight loss.  Will follow up to see if that PA was approved and what cost might be.  Labs drawn after starting spironolactone showed WNL.  Patient was agreeable to join our [facility] RMP research and was randomized to gropu 1. At her first follow up visit, pressure was much improved, to 125/84.  No changes were made to her medications.    Today she is in the office for her second follow up.  Unfortunately, her insurance would not cover medications for weight loss.  Despite this, she has been working hard on her diet - eating more vegetables and healthy proteins.  She just recently gave up salad dressing and is now starting to work on portion control.  She does note occasional episodes of lightheadedness, but is dealing with an ear problem and suspects they are related.    Blood Pressure Goal:  130/80  Current Medications: amlodipine 5 mg bid, clonidine 0.1 mg bid, metoprolol tart 25 mg bid, spironolactone 25 mg qd  Previously tried:   multiple medications "didn't work"  Family Hx: both sets of grandparents with hypertension and  strokes; parents both died of cancer, daughter also has hypertension  Social Hx:      Tobacco: quit 2007  Alcohol: occasional wine  Caffeine:  coffee once daily, no sodas, lots of water  Diet:  does meal prep. Lots of salads - chicken and beans for protein.  She showed me a picture of her dinner one evening and we discussed at length cutting the portions in half, especially if she will be getting a GLP1  Exercise: swims, had to stop for about 6 weeks, trying to get back, but having budget constraints  Started walking 2-3 weeks ago, does about 20 minutes  Home BP readings:    last 20 readings  (3 weeks)  AM - average 130/83, (range 103-149/64-96), previous average 131/80   PM - average 126/79, (range 111 -142/68-86), previous average 136/84     Accessory Clinical Findings    Lab Results  Component Value Date   CREATININE 0.77 06/20/2022   BUN 13 06/20/2022   NA 139 06/20/2022   K 4.6 06/20/2022   CL 102 06/20/2022   CO2 26 06/20/2022   Lab Results  Component Value Date   ALT 13 01/13/2022   AST 13 01/13/2022   ALKPHOS 93 01/13/2022   BILITOT 0.3 01/13/2022   Lab Results  Component Value Date   HGBA1C 6.1 (H) 01/13/2022    Screening for Secondary Hypertension:      06/08/2022   11:42  AM  Causes  Drugs/Herbals Screened     - Comments limiting salt, 16 oz coffee.  Occasional EtOH.  Renovascular HTN Screened     - Comments CT was - 10/2019  Sleep Apnea Screened     - Comments snores.  daytime somnolence  Thyroid Disease Screened  Hyperaldosteronism Screened     - Comments CT was - 10/2019  Pheochromocytoma N/A  Cushing's Syndrome N/A  Hyperparathyroidism N/A  Coarctation of the Aorta Screened     - Comments Blood pressure symmetric  Compliance Screened    Relevant Labs/Studies:    Latest Ref Rng & Units 06/20/2022    3:52 PM 01/13/2022    9:39 AM 11/07/2019   12:06 PM  Basic Labs  Sodium 134 - 144 mmol/L 139  138  136   Potassium 3.5 - 5.2 mmol/L 4.6  3.6   3.2   Creatinine 0.57 - 1.00 mg/dL 6.57  8.46  9.62        Latest Ref Rng & Units 01/13/2022    9:39 AM  Thyroid   TSH 0.450 - 4.500 uIU/mL 1.820                   Home Medications    Current Outpatient Medications  Medication Sig Dispense Refill   amLODipine (NORVASC) 5 MG tablet Take 1 tablet (5 mg total) by mouth 2 (two) times daily. 180 tablet 2   cloNIDine (CATAPRES) 0.1 MG tablet Take 1 tablet (0.1 mg total) by mouth 2 (two) times daily. 180 tablet 2   furosemide (LASIX) 20 MG tablet Take 20 mg by mouth as needed for fluid or edema.     gabapentin (NEURONTIN) 300 MG capsule Take 300 mg by mouth 2 (two) times daily.     meloxicam (MOBIC) 15 MG tablet Take 15 mg by mouth as needed.     metoprolol tartrate (LOPRESSOR) 25 MG tablet Take 1 tablet (25 mg total) by mouth 2 (two) times daily. 180 tablet 2   spironolactone (ALDACTONE) 25 MG tablet Take 1 tablet (25 mg total) by mouth daily. 90 tablet 3   traMADol (ULTRAM) 50 MG tablet Take 50 mg by mouth every 12 (twelve) hours as needed.     No current facility-administered medications for this visit.     Assessment & Plan       Resistant hypertension Assessment: BP is controlled in office BP 112/82 mmHg. Tolerates current medications well without any side effects Denies SOB, palpitation, chest pain, headaches,or swelling Some dizziness, associating with ear problems Praised her meal prep, cutting out salad dressing and working on portion control.   Encouraged her to continue increasing walking, find walking group if that helps  Plan:  Continue taking current medications Patient is working hard to make lifestyle modifications in order to improve BP and overall health Patient to keep record of BP readings with heart rate and report to [place] at the next visit Patient to follow up with PharmD in 1 month  Labs ordered today:  none  Phillips Hay PharmD CPP [facility]  [address] [place], [place] [address] [phone]

## 2022-08-03 NOTE — Patient Instructions (Signed)
  Take your BP meds as follows:  No changes to medications at this time  Check your blood pressure at home daily (if able) and keep record of the readings.  Hypertension "High blood pressure"  Hypertension is often called "The Silent Killer." It rarely causes symptoms until it is extremely  high or has done damage to other organs in the body. For this reason, you should have your  blood pressure checked regularly by your physician. We will check your blood pressure  every time you see a provider at one of our offices.   Your blood pressure reading consists of two numbers. Ideally, blood pressure should be  below 120/80. The first ("top") number is called the systolic pressure. It measures the  pressure in your arteries as your heart beats. The second ("bottom") number is called the diastolic pressure. It measures the pressure in your arteries as the heart relaxes between beats.  The benefits of getting your blood pressure under control are enormous. A 10-point  reduction in systolic blood pressure can reduce your risk of stroke by 27% and heart failure by 28%  Your blood pressure goal is < 130/80  To check your pressure at home you will need to:  1. Sit up in a chair, with feet flat on the floor and back supported. Do not cross your ankles or legs. 2. Rest your left arm so that the cuff is about heart level. If the cuff goes on your upper arm,  then just relax the arm on the table, arm of the chair or your lap. If you have a wrist cuff, we  suggest relaxing your wrist against your chest (think of it as Pledging the Flag with the  wrong arm).  3. Place the cuff snugly around your arm, about 1 inch above the crook of your elbow. The  cords should be inside the groove of your elbow.  4. Sit quietly, with the cuff in place, for about 5 minutes. After that 5 minutes press the power  button to start a reading. 5. Do not talk or move while the reading is taking place.  6. Record your  readings on a sheet of paper. Although most cuffs have a memory, it is often  easier to see a pattern developing when the numbers are all in front of you.  7. You can repeat the reading after 1-3 minutes if it is recommended  Make sure your bladder is empty and you have not had caffeine or tobacco within the last 30 min  Always bring your blood pressure log with you to your appointments. If you have not brought your monitor in to be double checked for accuracy, please bring it to your next appointment.  You can find a list of quality blood pressure cuffs at [email or web address].org

## 2022-08-03 NOTE — Assessment & Plan Note (Signed)
Assessment: BP is controlled in office BP 112/82 mmHg. Tolerates current medications well without any side effects Denies SOB, palpitation, chest pain, headaches,or swelling Some dizziness, associating with ear problems Praised her meal prep, cutting out salad dressing and working on portion control.   Encouraged her to continue increasing walking, find walking group if that helps  Plan:  Continue taking current medications Patient is working hard to make lifestyle modifications in order to improve BP and overall health Patient to keep record of BP readings with heart rate and report to [place] at the next visit Patient to follow up with PharmD in 1 month  Labs ordered today:  none

## 2022-09-14 ENCOUNTER — Ambulatory Visit: Payer: Commercial Managed Care - HMO

## 2022-09-21 ENCOUNTER — Ambulatory Visit: Payer: Commercial Managed Care - HMO

## 2022-09-28 ENCOUNTER — Encounter: Payer: Self-pay | Admitting: Pharmacist Clinician (PhC)/ Clinical Pharmacy Specialist

## 2022-09-28 ENCOUNTER — Telehealth (HOSPITAL_BASED_OUTPATIENT_CLINIC_OR_DEPARTMENT_OTHER): Payer: Self-pay | Admitting: *Deleted

## 2022-09-28 ENCOUNTER — Other Ambulatory Visit (HOSPITAL_COMMUNITY): Payer: Self-pay

## 2022-09-28 ENCOUNTER — Ambulatory Visit
Payer: Commercial Managed Care - HMO | Attending: Cardiovascular Disease | Admitting: Pharmacist Clinician (PhC)/ Clinical Pharmacy Specialist

## 2022-09-28 DIAGNOSIS — I1A0 Resistant hypertension: Secondary | ICD-10-CM | POA: Diagnosis not present

## 2022-09-28 DIAGNOSIS — I10 Essential (primary) hypertension: Secondary | ICD-10-CM

## 2022-09-28 NOTE — Assessment & Plan Note (Addendum)
Assessment: BP is slightly elevated in office BP 133/84  mmHg;  Tolerates current medications well without any side effects Denies SOB, palpitation, chest pain, headaches,or swelling Reiterated the importance of regular exercise and low salt diet   Plan:  Continue taking amlodipine, metoprolol, clonidine and spironolactone Patient to keep record of BP readings with heart rate and report to [place] at the next visit Patient to follow up with Dr. Duke Salvia in 1 month at [facility] office  Patient was asked to take BP cuff back to that appointment in October Labs ordered today:  none

## 2022-09-28 NOTE — Patient Instructions (Signed)
Follow up appointment: with Dr. Duke Salvia at the [facility] office on October 23.  Please remember to take the blood pressure device back when you go to this appointment.   Take your BP meds as follows:  continue with your current medications  Check your blood pressure at home daily (if able) and keep record of the readings.  Hypertension "High blood pressure"  Hypertension is often called "The Silent Killer." It rarely causes symptoms until it is extremely  high or has done damage to other organs in the body. For this reason, you should have your  blood pressure checked regularly by your physician. We will check your blood pressure  every time you see a provider at one of our offices.   Your blood pressure reading consists of two numbers. Ideally, blood pressure should be  below 120/80. The first ("top") number is called the systolic pressure. It measures the  pressure in your arteries as your heart beats. The second ("bottom") number is called the diastolic pressure. It measures the pressure in your arteries as the heart relaxes between beats.  The benefits of getting your blood pressure under control are enormous. A 10-point  reduction in systolic blood pressure can reduce your risk of stroke by 27% and heart failure by 28%  Your blood pressure goal is < 130/80  To check your pressure at home you will need to:  1. Sit up in a chair, with feet flat on the floor and back supported. Do not cross your ankles or legs. 2. Rest your left arm so that the cuff is about heart level. If the cuff goes on your upper arm,  then just relax the arm on the table, arm of the chair or your lap. If you have a wrist cuff, we  suggest relaxing your wrist against your chest (think of it as Pledging the Flag with the  wrong arm).  3. Place the cuff snugly around your arm, about 1 inch above the crook of your elbow. The  cords should be inside the groove of your elbow.  4. Sit quietly, with the cuff in  place, for about 5 minutes. After that 5 minutes press the power  button to start a reading. 5. Do not talk or move while the reading is taking place.  6. Record your readings on a sheet of paper. Although most cuffs have a memory, it is often  easier to see a pattern developing when the numbers are all in front of you.  7. You can repeat the reading after 1-3 minutes if it is recommended  Make sure your bladder is empty and you have not had caffeine or tobacco within the last 30 min  Always bring your blood pressure log with you to your appointments. If you have not brought your monitor in to be double checked for accuracy, please bring it to your next appointment.  You can find a list of quality blood pressure cuffs at [email or web address]

## 2022-09-28 NOTE — Progress Notes (Signed)
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 Duke Salvia discussed wegovy - will see if covered    Not on File  History of Present Illness    Veronica Gonzales is a 52 y.o. female patient who was referred to the Advanced Hypertension Clinic by Dr. Valentina Shaggy after BP reading of 148/102. At the time she was on amlodipine and clonidine.  She told Dr. Duke Salvia she was initially diagnosed at age 47 and started medication by age 31.  Started on amlodipine, with clonidine and metoprolol added with time.  Dr. Duke Salvia added spironolactone 25 mg as well as Wegovy 0.25 mg for weight loss.  Will follow up to see if that PA was approved and what cost might be.  Labs drawn after starting spironolactone showed WNL.  Patient was agreeable to join our [facility] RMP research and was randomized to gropu 1. At her first follow up visit, pressure was much improved, to 125/84.  No changes were made to her medications.  At second follow up her pressure was again at goal and we discussed her lifestyle modifications around both BP control and weight loss.   Last month I saw her for second follow up.  She was working hard on dietary modifications, as insurance would not cover weight management medications.    Today she returns for follow up.  Having more back pain today, rates it at 7/10.  Notes that she did some shopping yesterday and will often feel worse the day after.  Recently started tizanidine to see if she could get better relief.    Blood Pressure Goal:  130/80  Current Medications: amlodipine 5 mg bid, clonidine 0.1 mg bid, metoprolol tart 25 mg bid, spironolactone 25 mg qd  Previously tried:   multiple  medications "didn't work"  Family Hx: both sets of grandparents with hypertension and strokes; parents both died of cancer, daughter also has hypertension  Social Hx:      Tobacco: quit 2007  Alcohol: occasional wine  Caffeine:  coffee once daily, no sodas, lots of water  Diet:  does meal prep. Lots of salads - chicken and beans for protein.  She showed me a picture of her dinner one evening and we discussed at length cutting the portions in half, especially if she will be getting a GLP1  Exercise: swims, had to stop for about 6 weeks, trying to get back, but having budget constraints  Started walking 2-3 weeks ago, does about 20 minutes  Home BP readings:    last 12 readings  (3 weeks)  AM - average  128/82  previous averages 130/83,  131/80   PM - average  125/76  previous averages 126/79,  136/84     Accessory Clinical Findings    Lab Results  Component Value Date   CREATININE 0.77 06/20/2022   BUN 13 06/20/2022   NA 139 06/20/2022   K 4.6 06/20/2022   CL 102 06/20/2022   CO2 26 06/20/2022   Lab Results  Component Value Date   ALT 13 01/13/2022   AST 13 01/13/2022   ALKPHOS 93 01/13/2022   BILITOT 0.3 01/13/2022   Lab Results  Component Value Date  HGBA1C 6.1 (H) 01/13/2022    Screening for Secondary Hypertension:      06/08/2022   11:42 AM  Causes  Drugs/Herbals Screened     - Comments limiting salt, 16 oz coffee.  Occasional EtOH.  Renovascular HTN Screened     - Comments CT was - 10/2019  Sleep Apnea Screened     - Comments snores.  daytime somnolence  Thyroid Disease Screened  Hyperaldosteronism Screened     - Comments CT was - 10/2019  Pheochromocytoma N/A  Cushing's Syndrome N/A  Hyperparathyroidism N/A  Coarctation of the Aorta Screened     - Comments Blood pressure symmetric  Compliance Screened    Relevant Labs/Studies:    Latest Ref Rng & Units 06/20/2022    3:52 PM 01/13/2022    9:39 AM 11/07/2019   12:06 PM  Basic Labs  Sodium 134 -  144 mmol/L 139  138  136   Potassium 3.5 - 5.2 mmol/L 4.6  3.6  3.2   Creatinine 0.57 - 1.00 mg/dL 4.09  8.11  9.14        Latest Ref Rng & Units 01/13/2022    9:39 AM  Thyroid   TSH 0.450 - 4.500 uIU/mL 1.820                   Home Medications    Current Outpatient Medications  Medication Sig Dispense Refill   tiZANidine (ZANAFLEX) 2 MG tablet Take 2 mg by mouth 3 (three) times daily.     amLODipine (NORVASC) 5 MG tablet Take 1 tablet (5 mg total) by mouth 2 (two) times daily. 180 tablet 2   cloNIDine (CATAPRES) 0.1 MG tablet Take 1 tablet (0.1 mg total) by mouth 2 (two) times daily. 180 tablet 2   furosemide (LASIX) 20 MG tablet Take 20 mg by mouth as needed for fluid or edema.     gabapentin (NEURONTIN) 300 MG capsule Take 300 mg by mouth 2 (two) times daily.     metoprolol tartrate (LOPRESSOR) 25 MG tablet Take 1 tablet (25 mg total) by mouth 2 (two) times daily. 180 tablet 2   spironolactone (ALDACTONE) 25 MG tablet Take 1 tablet (25 mg total) by mouth daily. 90 tablet 3   traMADol (ULTRAM) 50 MG tablet Take 50 mg by mouth every 12 (twelve) hours as needed.     No current facility-administered medications for this visit.     Assessment & Plan       Resistant hypertension Assessment: BP is slightly elevated in office BP 133/84  mmHg;  Tolerates current medications well without any side effects Denies SOB, palpitation, chest pain, headaches,or swelling Reiterated the importance of regular exercise and low salt diet   Plan:  Continue taking amlodipine, metoprolol, clonidine and spironolactone Patient to keep record of BP readings with heart rate and report to [place] at the next visit Patient to follow up with Dr. Duke Salvia in 1 month at [facility] office  Patient was asked to take BP cuff back to that appointment in October Labs ordered today:  none  Phillips Hay PharmD CPP [facility]  [address] [place], [place] [address] [phone]

## 2022-09-28 NOTE — Telephone Encounter (Signed)
Patient following up on sleep PA, will forward to sleep pool to follow up

## 2022-11-01 ENCOUNTER — Encounter (HOSPITAL_BASED_OUTPATIENT_CLINIC_OR_DEPARTMENT_OTHER): Payer: Commercial Managed Care - HMO | Admitting: Cardiovascular Disease

## 2022-11-08 ENCOUNTER — Telehealth: Payer: Self-pay

## 2022-11-08 NOTE — Telephone Encounter (Signed)
Left VM with PIN for [employer] Device  and callback number for any questions.

## 2023-04-01 ENCOUNTER — Other Ambulatory Visit: Payer: Self-pay | Admitting: Cardiovascular Disease

## 2023-07-24 ENCOUNTER — Ambulatory Visit (HOSPITAL_BASED_OUTPATIENT_CLINIC_OR_DEPARTMENT_OTHER): Attending: Family Medicine | Admitting: Physical Therapy

## 2023-07-24 ENCOUNTER — Encounter (HOSPITAL_BASED_OUTPATIENT_CLINIC_OR_DEPARTMENT_OTHER): Payer: Self-pay | Admitting: Physical Therapy

## 2023-07-24 ENCOUNTER — Other Ambulatory Visit: Payer: Self-pay

## 2023-07-24 DIAGNOSIS — R2689 Other abnormalities of gait and mobility: Secondary | ICD-10-CM | POA: Insufficient documentation

## 2023-07-24 DIAGNOSIS — M6281 Muscle weakness (generalized): Secondary | ICD-10-CM | POA: Insufficient documentation

## 2023-07-24 DIAGNOSIS — M5459 Other low back pain: Secondary | ICD-10-CM | POA: Diagnosis present

## 2023-07-24 NOTE — Therapy (Signed)
 OUTPATIENT PHYSICAL THERAPY THORACOLUMBAR EVALUATION   Patient Name: Veronica Gonzales MRN: [id number] DOB:26-Nov-1970, 53 y.o., female Today's Date: 07/24/2023  END OF SESSION:  PT End of Session - 07/24/23 1323     Visit Number 1    Date for PT Re-Evaluation 09/07/23    PT Start Time 1016    PT Stop Time 1055    PT Time Calculation (min) 39 min    Activity Tolerance Patient tolerated treatment well    Behavior During Therapy [facility] for tasks assessed/performed          Past Medical History:  Diagnosis Date   Hypertension    Morbid obesity (HCC) 06/08/2022   Sciatic nerve pain    Past Surgical History:  Procedure Laterality Date   CARPAL TUNNEL RELEASE     CESAREAN SECTION     Patient Active Problem List   Diagnosis Date Noted   Morbid obesity (HCC) 06/08/2022   Abnormal perimenopausal bleeding 02/19/2022   Resistant hypertension 02/19/2022   Urticaria 01/13/2022   Lumbar radiculopathy 04/13/2021    PCP: Suzen Lager NP  REFERRING PROVIDER: Faye Lauraine PARAS, FNP   REFERRING DIAG: M54.16 (ICD-10-CM) - Radiculopathy, lumbar region   Rationale for Evaluation and Treatment: Rehabilitation  THERAPY DIAG:  Other low back pain  Muscle weakness (generalized)  Other abnormalities of gait and mobility  ONSET DATE: Chronic with exacerbation over past few months  SUBJECTIVE:                                                                                                                                                                                           SUBJECTIVE STATEMENT: I am better.  I had flared the last time I was at the MD but since then I have gotten my medical disability and completely stopped working.  As LBP decreased the numbness and tingling has decreased.  Occurs maybe 1 x week but can last up to 2 days. Sleep ok with meds.  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 Yes: NPRS scale: current and normal pain 2/10; worst 6/10 Pain location: LB with radiation into lle Pain description: numbness Aggravating factors: standing and walking x 30 minutes; stair climbing Relieving factors: forward flex, meds, sitting  PRECAUTIONS: None   WEIGHT BEARING RESTRICTIONS No   FALLS:  Has patient fallen in last 6 months? 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  DIAGNOSTIC FINDINGS:  None recent in chart  PATIENT SURVEYS:  Modified Oswestry:  MODIFIED OSWESTRY DISABILITY SCALE  Date: 07/24/23 Score  Pain intensity 3 =  Pain medication provides me with moderate relief from pain.  2. Personal care (washing, dressing, etc.) 2 =  It is painful to take care of myself, and I am slow and careful.  3. Lifting 4 = I can lift only very light weights  4. Walking 3 =  Pain prevents me from walking more than  mile.  5. Sitting 2 =  Pain prevents me from sitting more than 1 hour.  6. Standing 3 =  Pain prevents me from standing more than 1/2 hour.  7. Sleeping 1 = I can sleep well only by using pain medication.  8. Social Life 5 =  I have hardly any social life because of my pain.  9. Traveling 4 = My pain restricts my travel to short necessary journeys under 1/2 hour.  10. Employment/ Homemaking 2 = I can perform most of my homemaking/job duties, but pain prevents me from  performing more physically stressful activities (eg, lifting, vacuuming).  Total 29/50= 58%   Interpretation of scores: Score Category Description  0-20% Minimal Disability The patient can cope with most living activities. Usually no treatment is indicated apart from advice on lifting, sitting and exercise  21-40% Moderate Disability The patient experiences more pain and difficulty with sitting, lifting and standing. Travel and social life are more difficult and they may be disabled from work. Personal care, sexual activity and sleeping are not grossly affected, and the patient can usually be managed by conservative means  41-60% Severe Disability Pain remains the main problem in this group, but activities of daily living are affected. These patients require a detailed investigation  61-80% Crippled Back pain impinges on all aspects of the patient's life. Positive intervention is required  81-100% Bed-bound  These patients are either bed-bound or exaggerating their symptoms  Bluford FORBES Zoe DELENA Karon DELENA, et al. Surgery versus conservative management of stable thoracolumbar fracture: the PRESTO feasibility RCT. [place] ([place]): [employer]; 2021 Nov. [facility] Technology Assessment, No. 25.62.) Appendix 3, Oswestry Disability Index category descriptors. Available from: [employer]cz  Minimally Clinically Important Difference (MCID) = 12.8%  COGNITION: Overall cognitive status: Within functional limits for tasks assessed     SENSATION: WFL  MUSCLE LENGTH: Hamstrings: Right wfl ; Piriformis tight Left wfl   POSTURE: rounded shoulders, forward head, increased thoracic kyphosis, and flexed trunk   PALPATION: No TTP  LUMBAR ROM:   WFL: no pain  LOWER EXTREMITY ROM:     Active  Right eval Left eval  Hip flexion    Hip extension    Hip abduction    Hip adduction    Hip internal rotation    Hip external rotation limited full  Knee flexion     Knee extension    Ankle dorsiflexion    Ankle plantarflexion    Ankle inversion    Ankle eversion     (Blank rows = not tested)  LOWER EXTREMITY MMT:  MMT Right eval Left eval  Hip flexion 3+ 4  Hip extension    Hip abduction 5 5  Hip adduction 5 5  Hip internal rotation    Hip external rotation    Knee flexion 4+ 4+  Knee extension 5 5  Ankle dorsiflexion    Ankle plantarflexion    Ankle inversion    Ankle eversion     (Blank rows = not tested)  LUMBAR SPECIAL TESTS:  Straight leg raise test: Negative and Slump test: Negative  FUNCTIONAL TESTS:  5 times sit to stand: from pool bench 33.19 (pain in knees and LB) Timed up and go (TUG): 12.00   4 stage balance: Passed 1&2.  Tandem x 8s; SLS 5s GAIT: Distance walked: 500 Assistive device utilized: None Level of assistance: Complete Independence Comments: wfl  TREATMENT  Eval Self care:Posture and body mechanic instruction; frequency of exercise for optimal outcomes; Prior HEP; Pusing cart through grocery for ue support; use of AD.                                                                                                                                PATIENT EDUCATION:  Education details: Discussed eval findings, rehab rationale, aquatic program progression/POC and pools in area. Patient is in agreement  Person educated: Patient Education method: Explanation Education comprehension: verbalized understanding  HOME EXERCISE PROGRAM: 01/17/22:Access Code: [email or web address]   ASSESSMENT:  CLINICAL IMPRESSION: Patient is a 53 y.o. f who was seen today for physical therapy evaluation and treatment for LBP.  She is well know to this clinic having been seen for similar dx with completion ~ 1 1/2 yrs ago.  She presents today after having received disability, not working for the past month with resultant reduction of overall pian sensitivity in LB as well as radicular pain in lle. Per exam she has rle weakness vs left,  some balance deficits and rates herself at a 58% disability rating as per LEFS.  She is not using ad and overal pain sensitivity has greatly reduced as compared to md note in early June.  She is a good candidate for aquatic intervention and will benefit from the properties of water to progress towards functional goals.   OBJECTIVE IMPAIRMENTS Abnormal gait, difficulty walking, decreased ROM, decreased strength, hypomobility, increased fascial restrictions, increased muscle spasms, impaired flexibility, impaired sensation, improper body mechanics, postural dysfunction, obesity, and pain.    ACTIVITY LIMITATIONS carrying, lifting, bending, sitting, standing, squatting, stairs, transfers, locomotion level, and caring for others   PARTICIPATION LIMITATIONS: driving, shopping, community activity, occupation, and yard work   PERSONAL FACTORS Age, Fitness, Past/current experiences, Profession, and Time since onset of injury/illness/exacerbation are also affecting patient's functional outcome.    REHAB POTENTIAL: Good   CLINICAL DECISION MAKING: Stable/uncomplicated   EVALUATION COMPLEXITY: Low   GOALS: Goals reviewed with patient? Yes  SHORT TERM GOALS: Target date: 08/10/23  Pt will tolerate full aquatic sessions consistently without increase  in pain and with improving function to demonstrate good toleration and effectiveness of intervention.  Baseline: Goal status: INITIAL  2.  Pt will report checking out [facility] for access to exercise equipment and pool to continue with HEP's Baseline:  Goal status: INITIAL    LONG TERM GOALS: Target date: 09/07/23  Pt to improve on ODI by  13-15%  (MCID) to demonstrate statistically significant Improvement in function. Baseline: 29/50= 58% Goal status: INITIAL  2.  Pt will tolerate stair climbing using alternating pattern ascending and descending 6 steps without use of handrail Baseline:  Goal status: INITIAL  3.  Pt will report  toleration of walking through grocery store x 30 minutes Baseline:  Goal status: INITIAL  4.  Pt will be indep with final HEP's (land and aquatic as appropriate) for continued management of condition Baseline:  Goal status: INITIAL   PLAN:  PT FREQUENCY: 1-2x/week  PT DURATION: 6 weeks  PLANNED INTERVENTIONS: 97164- PT Re-evaluation, 97750- Physical Performance Testing, 97110-Therapeutic exercises, 97530- Therapeutic activity, [id number]- Neuromuscular re-education, 97535- Self Care, [id number]- Manual therapy, [id number]- Gait training, [phone]- Aquatic Therapy, [phone]- Electrical stimulation (manual), [id number]- Ionotophoresis 4mg /ml Dexamethasone, 79439 (1-2 muscles), [id number] (3+ muscles)- Dry Needling, Patient/Family education, Balance training, Stair training, Taping, Joint mobilization, DME instructions, Cryotherapy, and Moist heat.  PLAN FOR NEXT SESSION: general strengthening and stretching of core and LE.  Pain management   Ronal Foots) Kaseem Vastine MPT 07/24/23 1:25 PM [facility] [address] [place], [place], [address] Phone: [phone]   Fax:  [phone]  For all possible CPT codes, reference the Planned Interventions line above.     Check all conditions that are expected to impact treatment: {Conditions expected to impact treatment:Morbid obesity and Musculoskeletal disorders   If treatment provided at initial evaluation, no treatment charged due to lack of authorization.
